# Patient Record
Sex: Male | Born: 1935 | Race: White | Hispanic: No | Smoking: Former smoker
Health system: Southern US, Community
[De-identification: ages and names within clinical notes are randomized; demographics above are authoritative.]

## PROBLEM LIST (undated history)

## (undated) DIAGNOSIS — I719 Aortic aneurysm of unspecified site, without rupture: Secondary | ICD-10-CM

## (undated) DIAGNOSIS — C61 Malignant neoplasm of prostate: Secondary | ICD-10-CM

## (undated) HISTORY — PX: ABDOMINAL SURGERY: SHX537

## (undated) HISTORY — PX: HERNIA REPAIR: SHX51

---

## 1998-12-11 ENCOUNTER — Ambulatory Visit (HOSPITAL_COMMUNITY): Admission: RE | Admit: 1998-12-11 | Discharge: 1998-12-11 | Payer: Self-pay | Admitting: General Surgery

## 1998-12-11 ENCOUNTER — Encounter (INDEPENDENT_AMBULATORY_CARE_PROVIDER_SITE_OTHER): Payer: Self-pay | Admitting: Specialist

## 2005-04-22 ENCOUNTER — Encounter: Admission: RE | Admit: 2005-04-22 | Discharge: 2005-04-22 | Payer: Self-pay | Admitting: Family Medicine

## 2008-01-29 ENCOUNTER — Ambulatory Visit (HOSPITAL_COMMUNITY): Admission: RE | Admit: 2008-01-29 | Discharge: 2008-01-29 | Payer: Self-pay | Admitting: Urology

## 2008-02-09 ENCOUNTER — Ambulatory Visit: Payer: Self-pay | Admitting: Vascular Surgery

## 2008-03-01 ENCOUNTER — Ambulatory Visit: Payer: Self-pay | Admitting: Vascular Surgery

## 2008-03-12 ENCOUNTER — Encounter: Payer: Self-pay | Admitting: Vascular Surgery

## 2008-03-12 ENCOUNTER — Ambulatory Visit: Payer: Self-pay | Admitting: Vascular Surgery

## 2008-03-12 ENCOUNTER — Inpatient Hospital Stay (HOSPITAL_COMMUNITY): Admission: RE | Admit: 2008-03-12 | Discharge: 2008-03-16 | Payer: Self-pay | Admitting: Vascular Surgery

## 2008-03-28 ENCOUNTER — Ambulatory Visit: Payer: Self-pay | Admitting: Vascular Surgery

## 2008-04-26 ENCOUNTER — Ambulatory Visit: Payer: Self-pay | Admitting: Vascular Surgery

## 2009-02-17 ENCOUNTER — Other Ambulatory Visit: Payer: Self-pay | Admitting: Urology

## 2009-02-18 ENCOUNTER — Other Ambulatory Visit: Payer: Self-pay | Admitting: Urology

## 2009-02-18 ENCOUNTER — Inpatient Hospital Stay (HOSPITAL_COMMUNITY): Admission: AD | Admit: 2009-02-18 | Discharge: 2009-02-19 | Payer: Self-pay | Admitting: Urology

## 2009-02-24 ENCOUNTER — Emergency Department (HOSPITAL_COMMUNITY): Admission: EM | Admit: 2009-02-24 | Discharge: 2009-02-24 | Payer: Self-pay | Admitting: Emergency Medicine

## 2010-07-01 LAB — URINALYSIS, ROUTINE W REFLEX MICROSCOPIC
Bilirubin Urine: NEGATIVE
Nitrite: NEGATIVE
Protein, ur: NEGATIVE mg/dL
Urobilinogen, UA: 0.2 mg/dL (ref 0.0–1.0)
pH: 6.5 (ref 5.0–8.0)

## 2010-07-01 LAB — BASIC METABOLIC PANEL
CO2: 27 mEq/L (ref 19–32)
Calcium: 8.5 mg/dL (ref 8.4–10.5)
Chloride: 102 mEq/L (ref 96–112)
GFR calc Af Amer: >60 mL/min (ref >60)
Sodium: 136 mEq/L (ref 135–145)

## 2010-07-01 LAB — CBC
HCT: 37.5 % — ABNORMAL LOW (ref 39.0–52.0)
MCHC: 33.2 g/dL (ref 30.0–36.0)
MCHC: 34.1 g/dL (ref 30.0–36.0)
MCHC: 34.4 g/dL (ref 30.0–36.0)
MCV: 92.2 fL (ref 78.0–100.0)
MCV: 92.2 fL (ref 78.0–100.0)
Platelets: 107 10*3/uL — ABNORMAL LOW (ref 150–400)
Platelets: 116 10*3/uL — ABNORMAL LOW (ref 150–400)
RBC: 4.62 MIL/uL (ref 4.22–5.81)
RDW: 13.6 % (ref 11.5–15.5)
RDW: 14 % (ref 11.5–15.5)
WBC: 6.3 10*3/uL (ref 4.0–10.5)
WBC: 9.9 10*3/uL (ref 4.0–10.5)

## 2010-07-01 LAB — DIFFERENTIAL
Basophils Absolute: 0 10*3/uL (ref 0.0–0.1)
Eosinophils Absolute: 0 10*3/uL (ref 0.0–0.7)
Eosinophils Relative: 0 % (ref 0–5)
Lymphocytes Relative: 13 % (ref 12–46)
Neutro Abs: 4.9 10*3/uL (ref 1.7–7.7)
Neutrophils Relative %: 78 % — ABNORMAL HIGH (ref 43–77)

## 2010-07-01 LAB — URINE MICROSCOPIC-ADD ON

## 2010-08-11 NOTE — Discharge Summary (Signed)
George Lara, George Lara           ACCOUNT NO.:  [id number]   MEDICAL RECORD NO.:  [id number]          PATIENT TYPE:  INP   LOCATION:  2041                         FACILITY:  [facility]   PHYSICIAN:  [name], M.D.    DATE OF BIRTH:  Oct 31, 1921   DATE OF ADMISSION:  03/12/2008  DATE OF DISCHARGE:  03/16/2008                               DISCHARGE SUMMARY   FINAL DISCHARGE DIAGNOSIS:  Infrarenal abdominal aortic aneurysm.   PROCEDURES PERFORMED:  Repair of infrarenal abdominal aortic aneurysm  utilizing 14-mm Hemashield graft by Dr. Arbie Cookey on March 12, 2008.  Complications none.   CONDITION AT DISCHARGE:  Stable, improving.   DISCHARGE MEDICATIONS:  1. Percocet 5/325 one p.o. q.4 h. p.r.n. pain, total number of 30 were      given.  2. Cardizem CD 180 mg p.o. daily.   DISPOSITION:  He is being discharged home in stable condition with his  wounds healing well.  He is given careful instructions regarding the  care of his wounds and activity level.  He is to see Dr. Anne Fu on  March 27, 2008, at the [facility] at 9:45 a.m. and will be  seeing Dr. Arbie Cookey in 2 weeks with ABIs and staple removal.  The office  will make the appointments for Dr. Arbie Cookey.   BRIEF IDENTIFYING STATEMENT:  For complete details, please refer the  typed history and physical.  Briefly, this is a very pleasant 77-year-  old gentleman with prostate cancer was found to have a 10-cm infrarenal  abdominal aortic aneurysm, which was asymptomatic.  Dr. Arbie Cookey evaluated  him and recommended repair.  He was informed of the risks and benefits  of the procedure and after careful consideration, he elected to proceed  with surgery.   HOSPITAL COURSE:  Preoperative workup was completed as an outpatient.  He was brought in through same-day surgery and underwent the  aforementioned repair of his abdominal aortic aneurysm.  For complete  details, please refer the typed operative report.  The procedure was  without  complications.  He was returned to the postanesthesia care unit,  extubated.  He was observed overnight and was able to be transferred to  a bed on a surgical convalescent floor on the first postoperative day.  His diet and activity level were advanced as tolerated.  On March 16, 2008, he was desirous of discharge.  He was found to be stable.  His  wounds were healing well.  He was discharged home in stable condition.   During his hospitalization, he developed atrial fib/flutter which  converted to sinus rhythm with the judicious use of Cardizem.  We asked  [facility] to evaluate this, he was evaluated by Dr. Anne Fu.  We  do appreciate his diligent assistance.  He did obtain a TSH level, which  was 6.681.  This should be followed up in Dr. Anne Fu' office on March 27, 2008.      [name], PA      [name], M.D.  Electronically Signed    KEL/MEDQ  D:  03/16/2008  T:  03/16/2008  Job:  [id number] 

## 2010-08-11 NOTE — Assessment & Plan Note (Signed)
OFFICE VISIT   George Lara, George Lara  DOB:  20-Feb-1922                                       04/26/2008  NWGNF#:[id number]   Patient presents today for followup of his resection and grafting of an  abdominal aortic aneurysm, which was 10 cm in size.  This was found  during workup of prostate cancer.  Surgery was on 12/15.  Despite his  age of 45, patient had no difficulty whatsoever with surgery.  He  returned immediately to his usual preoperative activities and stamina.   He is here today for followup.  He is doing quite well.  His abdominal  incision looks good with no evidence of hernia.  He has 2+ femoral and  posterior tibial pulses.  His ankle-arm index  today is normal at 1  bilaterally.   He is released to return to full activity after an additional 6 weeks.  He is asked for no heavy lifting or abdominal strain for 6 more weeks.  He had questions regarding continuation of Diltiazem and will discuss  this with Dr. Anne Fu.  He also will continue his followup with Dr.  Retta Diones regarding his prostate cancer.  He will see [place] again on an as-  needed basis.   [name], M.D.  Electronically Signed   TFE/MEDQ  D:  04/26/2008  T:  04/29/2008  Job:  [id number]   cc:   Bertram Millard. Retta Diones, M.D.  Elsworth Soho, M.D.  Jake Bathe, MD

## 2010-08-11 NOTE — Consult Note (Signed)
George Lara, George Lara           ACCOUNT NO.:  [id number]   MEDICAL RECORD NO.:  [id number]          PATIENT TYPE:  INP   LOCATION:  2041                         FACILITY:  [facility]   PHYSICIAN:  Jake Bathe, MD      DATE OF BIRTH:  1921/09/20   DATE OF CONSULTATION:  03/13/2008  DATE OF DISCHARGE:                                 CONSULTATION   REQUESTING PHYSICIAN:  [name], MD   REASON FOR REFERRAL:  Mr. Maya is being seen at the request of Dr.  Arbie Cookey for evaluation of paroxysmal atrial flutter/fibrillation.   HISTORY OF PRESENT ILLNESS:  This is an 77 year old male whom I saw  previously in consultation for preoperative evaluation to abdominal  aortic aneurysm repair with a history of prostate cancer and hernia  repair in the 1980s who underwent abdominal aortic aneurysm repair with  a 14-mm Hemashield graft on March 12, 2008, at 11:05 a.m., who  yesterday in the evening hours developed new-onset atrial flutter at a  rapid ventricular response, rate of 132 beats per minute.  Cardizem drip  was utilized for rate control, and at approximately 4:00 a.m. this  morning, he converted back into normal sinus rhythm.  He denies any  chest pain, shortness of breath, fevers, chills.  He does have abdominal  discomfort surrounding his surgical site.  He has had no prior history  of atrial fibrillation.  He describes no stroke-like symptoms or no  prior stroke-like symptoms.   Prior to his surgery, a nuclear stress test was performed, which showed  an ejection fraction of 70% with no evidence of ischemia, overall low  risk.  His ECG prior to surgery shows sinus rhythm, right bundle-branch  block with PACs, and possible left atrial enlargement.  This is no  change from the prior ECG done in the office during presurgical  evaluation.  Telemetry, however, yesterday evening demonstrates atrial  flutter/coarse atrial fibrillation with ventricular response  approximately 132 beats  per minute.   PAST MEDICAL HISTORY:  1. Prostate cancer.  2. History of abdominal aortic aneurysm 10 cm, repaired by Dr. Arbie Cookey      as described above.  3. Hernia repair x2 in 1980s.   ALLERGIES:  CODEINE.   MEDICATIONS:  Prior to preoperative evaluation, he was taking no  medicines.  Currently, he is on Cardizem drip 5 mg an hour, famotidine,  Lasix 20 mg IV once a day.   SOCIAL HISTORY:  He is widowed, currently lives alone.  No smoking, quit  in the 1980s, although he does chew tobacco.  No alcohol use.  No  recreational drug use.  He is a retired Building control surveyor.   FAMILY HISTORY:  He has 1 brother that died of a massive heart attack at  age 52.  No other early family history of coronary artery disease.   REVIEW OF SYSTEMS:  No bleeding episodes, no syncope, no stroke-like  symptoms, no shortness of breath, no active chest pain.  Unless stated  above, all other 12 review of systems is negative.   PHYSICAL EXAMINATION:  VITAL SIGNS:  Blood pressure  currently 107/69,  sating 93% on 2 L, T-max was 99.1, T-current 96.5, heart rate currently  in the 70s, respirations 20.  GENERAL:  Alert and oriented x3 in no acute distress.  CARDIOVASCULAR:  Regular rate and rhythm with occasional ectopy.  LUNGS:  Clear to auscultation bilaterally anteriorly.  ABDOMEN:  Surgical scar clean, dry, and intact; nontender; positive  bowel sounds.  EXTREMITIES:  Trace edema bilateral lower extremity.  SKIN:  Warm, dry, and intact.  No rashes.  NEUROLOGIC:  Nonfocal.  No tremors noted.  PSYCH:  Normal affect.   LABORATORY DATA:  White count 10.9, hemoglobin 14, hematocrit 41,  platelet count 80.  Sodium 136, potassium 3.8, creatinine 0.8, glucose  147.  INR was 1.2.  Chest x-ray personally viewed showed mild  cardiomegaly with improved aeration when compared to prior chest x-ray.  ECGs personally viewed showed sinus rhythm with PACs, right bundle-  branch block which was chronic on March 13, 2008.  Telemetry at 1843  on March 13, 2008, demonstrated atrial flutter with rate of 132.  At  4:00 a.m. on March 14, 2008, he converted to normal sinus rhythm. TSH  pending.   ASSESSMENT AND PLAN:  This is an 77 year old male with new-onset  paroxysmal atrial fibrillation/flutter postoperatively.  1. Atrial flutter - currently in sinus rhythm, reassuring on Cardizem.      He did have frequent ectopy.  PACs which likely initiated the      propagation of atrial flutter.  I agree with the Cardizem use, and      I also agree with transition to p.o. 60 mg q.8 h. in the morning      with transition up on discharge to Cardizem long acting or CD 180      mg once a day.  His blood pressure should be able to tolerate this.      It is slightly low this morning.  He does not need to take his      perioperative beta-blocker with the Cardizem.  Reassuring ejection      fraction on nuclear stress test recently.  We will assess structure      of heart in close outpatient followup.  Given his short duration of      atrial flutter/fibrillation, aspirin is reasonable at this time,      Dr. Arbie Cookey feels comfortable initiating antiplatelet therapy.  No      need for Coumadin at this point.  No stroke-like symptoms noted.  2. Abnormal EKG - right bundle-branch block - chronic.  Note on      telemetry, he did break his atrial flutter with a pause, which is      common.  Pulse was less than 2 seconds.   I have a followup appointment scheduled for him on Wednesday, March 27, 2008, at 9:45 a.m.      Jake Bathe, MD  Electronically Signed     MCS/MEDQ  D:  03/14/2008  T:  03/15/2008  Job:  [id number]   cc:   [name], M.D.  Bryan Lemma. Manus Gunning, M.D.

## 2010-08-11 NOTE — Op Note (Signed)
George Lara, George Lara           ACCOUNT NO.:  [id number]   MEDICAL RECORD NO.:  [id number]          PATIENT TYPE:  INP   LOCATION:  2309                         FACILITY:  [facility]   PHYSICIAN:  [name], M.D.    DATE OF BIRTH:  07/30/1921   DATE OF PROCEDURE:  DATE OF DISCHARGE:                               OPERATIVE REPORT   PREOPERATIVE DIAGNOSIS:  A 10-cm infrarenal abdominal aortic aneurysm.   POSTOPERATIVE DIAGNOSES:  A 10-cm infrarenal abdominal aortic aneurysm.   PROCEDURE:  Resection and grafting of abdominal aortic aneurysm with a  14-mm aortic tube graft.   SURGEON:  Dr. Gretta Began, MD.   ASSISTANT:  [name], [facility].   ANESTHESIA:  General endotracheal.   COMPLICATIONS:  None.   DISPOSITION:  To recovery room, extubated and in stable condition.   PROCEDURE IN DETAIL:  The patient was taken to the operating room,  placed in supine position.  The area of the abdomen and both groins were  prepped and draped in usual sterile fashion.  Incisions were made from  the level of xiphoid, below the umbilicus and carried down through the  midline fascia with electrocautery.  The peritoneum was entered, and the  midline fascia was opened in line with the skin incision.  The [employer]  retractor was used for exposure.  The transverse colon and the omentum  reflected superiorly and the small bowel was reflected to the right.  The duodenum was mobilized off the aortic neck.  The patient had a very  large aneurysm with an angulated aortic neck.  By traction on the  aneurysm, the aorta was encircled below the level of the renal arteries  and was adequate for proximal anastomosis.  Next, the dissection was  extended down on to the aortic bifurcation and the common iliac arteries  were encircled with blue vessel loops bilaterally.  The patient was  given 25 g of mannitol and 8000 units of intravenous heparin.  After  adequate circulation time, the aorta was closed with a  Harken clamp  below the level of the renal arteries and the common iliac arteries were  occluded with Henley clamps bilaterally.  The aneurysm was opened with  electrocautery through the midline longitudinally.  A large amount of  mural thrombus was removed.  The lumbar back bridges were controlled  with 2-0 silk figure-of-eight sutures.  The aorta was transected below  the level of the proximal clamp.  The bifurcation was visualized and the  aneurysm stopped at the level of the bifurcation.  The aorta was  transected at the level of the aortic bifurcation and was adequate for  distal anastomosis.  A 14 mm Dacron graft, Hemashield graft was brought  onto the field, and using a felt strip for reinforcement was sewn end-to-  end to the proximal aorta with a running 3-0 Prolene suture.  The  anastomosis was tested and found to be adequate.  Next, the graft was  cut to appropriate length and was sewn end-to-end to the distal aorta at  the level of the aortic bifurcation.  Again, a felt strip was  used for  reinforcement.  Prior to completion of the anastomosis, the usual  flushing maneuvers were undertaken with flushing of both iliac and the  aortic graft.  Anastomosis was completed and flow was restored first to  the left then the right leg.  Good femoral pulses were noted.  The  patient was given 50 mg of protamine to reverse the heparin.  The  aneurysm with the sac was closed over the graft with a running 2-0  Vicryl suture.  Next, the retroperitoneum was closed with a running 2-0  Vicryl suture.  Finally, the thrombi was run to its entirety and was  placed back in the pelvis.  The transverse colon and omentum were placed  over this.  The midline fascia was closed with a #1 PDS suture beginning  proximally and distally and tying in the middle.  The skin was closed  with skin staples.  Sterile dressing was applied.  The patient had  palpable dorsalis pedis pulses and was extubated in the  operating room  and transferred to the recovery room in stable condition.      [name], M.D.  Electronically Signed     TFE/MEDQ  D:  03/12/2008  T:  03/13/2008  Job:  [id number]   cc:   L. Lupe Carney, M.D.  Jake Bathe, MD

## 2010-08-11 NOTE — Consult Note (Signed)
NEW PATIENT CONSULTATION   George Lara, George Lara  DOB:  May 11, 1921                                       02/09/2008  EAVWU#:[id number]   The patient presents today for discussion regarding recent finding of a  large 10 cm infrarenal abdominal aortic aneurysm.  The patient is a very  pleasant 77 year old gentleman with a history of prostate cancer.  He  was being evaluated by Dr. Marcine Lara with a CT scan on February 07, 2008.  This showed an incidental finding of a 9.7 cm abdominal  aortic aneurysm.  I am seeing him for further discussion.  He had no  known history of this and does not have a family history of aneurysmal  disease.  He is extremely active despite his age of 26.  He specifically  denies any cardiac disease.  He does have a history of inguinal hernia  repair.   CURRENT MEDICATIONS:  None.   FAMILY HISTORY:  Maternal history of myocardial infarction.  Family  history of lung cancer.   SOCIAL HISTORY:  He is widowed.  He is retired.  Quit smoking 20 years  ago, does chew tobacco, does not have alcohol consumption.   PHYSICAL EXAM:  He is a well-developed, well-nourished white male,  appearing stated age 13.  His blood pressure 130/89, pulse 83,  respirations 18.  His carotid arteries have bruits bilaterally.  Heart:  Regular rate and rhythm.  Chest:  clear.  He does have easily palpable,  nontender abdominal aortic aneurysm.  He has 2+ femoral and 2+ dorsalis  pedis pulses.  He does have prominent popliteal pulses bilaterally.  He  has his disc from urology center with him.  Unfortunately, this will not  load in our computer and we discussed this with the urology center and  they will send [place] a new disc next week, it is Friday.  I discussed  options of repair with the patient and his daughter.  I explained that  despite his age of 32 with a 10 cm aneurysm, this certainly is a most  life-threatening issue.  I did explain open repair and  stent graft  repair depending on his CT findings.  My office will check with Dr.  Clovis Lara to determine his preference for cardiologist, but would  recommend a preoperative cardiac evaluation.  I explained to the patient  and his family the need for this, due to his unknown cardiac status, to  risk stratify him for aortic surgery.  We will discuss this further with  him following cardiac evaluation and plan for elective repair, either  open or stent graft, assuming his cardiac evaluation is negative.   George Lara, M.D.  Electronically Signed   TFE/MEDQ  D:  02/09/2008  T:  02/12/2008  Job:  [id number]   cc:   George Lara, M.D.  George Lara, M.D.

## 2010-08-11 NOTE — H&P (Signed)
HISTORY AND PHYSICAL EXAMINATION   March 01, 2008   Re:  George Lara, George Lara                 DOB:  [birth year]   ADMISSION DIAGNOSIS:  Large infrarenal abdominal aortic aneurysm.   HISTORY OF PRESENT ILLNESS:  The patient is a very pleasant 77 year old  white male with prostate cancer.  He was undergoing CT scan for further  evaluation of this by Dr. Marcine Matar and was found to have a 10  cm infrarenal abdominal aortic aneurysm.  He had no symptoms referable  to this.  I have seen him for initial evaluation on 11/13 and have  recommended cardiology evaluation to rule out any hiding other cardiac  difficulty.  Despite his age of 43, he is extremely active with no major  medical difficulties.  He has no prior cardiac disease and has no  physical limitations.  He has undergone a recent cardiac evaluation to  include a stress perfusion scan that shows a normal ejection fraction at  70%.  Good exercise capacity and no evidence of reversible ischemia.   FAMILY HISTORY:  Negative for premature atherosclerotic disease.  Does  have a history of lung cancer in his family.   SOCIAL HISTORY:  He is married and is retired.  He quit smoking 20 years  ago.  Does chew tobacco and does not have alcohol consumption.   PHYSICAL EXAM:  Well-developed, well-nourished thin white male appearing  his stated age of 21.  Blood pressure is 144/74, pulse 80, respirations  16.  His carotid arteries are without bruits bilaterally.  He has 2+  radial, 2+ femoral, 2+ posterior tibial pulses.  Abdominal exam reveals  a large, nontender abdominal aortic aneurysm.   I reviewed his CAT scan with the patient and his son and daughter  present.  I explained that he is not a candidate for endovascular repair  due to an extremely angulated neck of his infrarenal aorta.  I explained  that his only option would be open surgical repair.  I did explain the  potential risk of the procedure to  include renal dysfunction, GI  dysfunction, pulmonary and cardiac dysfunction.  I explained the  magnitude of this procedure, including expected 1 to 2 day intensive  care stay and a 5 to 7 day hospitalization, and return to usual  activities and stamina at approximately 2 months postoperatively.  He  understands and wishes to proceed with surgery, and we have scheduled  this for admission day surgery on 12/15.   [name], M.D.  Electronically Signed   TFE/MEDQ  D:  03/01/2008  T:  03/04/2008  Job:  [id number]   cc:   Bertram Millard. Retta Diones, M.D.  Elsworth Soho, M.D.  Jake Bathe, MD

## 2011-01-01 LAB — BASIC METABOLIC PANEL
BUN: 10 mg/dL (ref 6–23)
BUN: 9 mg/dL (ref 6–23)
CO2: 23 mEq/L (ref 19–32)
CO2: 24 mEq/L (ref 19–32)
CO2: 24 mEq/L (ref 19–32)
Calcium: 8.3 mg/dL — ABNORMAL LOW (ref 8.4–10.5)
Calcium: 8.7 mg/dL (ref 8.4–10.5)
Creatinine, Ser: 0.8 mg/dL (ref 0.4–1.5)
GFR calc Af Amer: >60 mL/min (ref >60)
GFR calc Af Amer: >60 mL/min (ref >60)
GFR calc non Af Amer: >60 mL/min (ref >60)
Glucose, Bld: 144 mg/dL — ABNORMAL HIGH (ref 70–99)
Glucose, Bld: 96 mg/dL (ref 70–99)
Potassium: 4.4 mEq/L (ref 3.5–5.1)
Sodium: 136 mEq/L (ref 135–145)
Sodium: 137 mEq/L (ref 135–145)

## 2011-01-01 LAB — POCT I-STAT 7, (LYTES, BLD GAS, ICA,H+H)
Acid-base deficit: 3 mmol/L — ABNORMAL HIGH (ref 0.0–2.0)
Bicarbonate: 23.3 mEq/L (ref 20.0–24.0)
Calcium, Ion: 1.09 mmol/L — ABNORMAL LOW (ref 1.12–1.32)
O2 Saturation: 100 %
Potassium: 4.4 mEq/L (ref 3.5–5.1)
TCO2: 25 mmol/L (ref 0–100)
pCO2 arterial: 42.6 mmHg (ref 35.0–45.0)
pH, Arterial: 7.334 — ABNORMAL LOW (ref 7.350–7.450)

## 2011-01-01 LAB — CBC
HCT: 41.2 % (ref 39.0–52.0)
HCT: 43 % (ref 39.0–52.0)
HCT: 47.5 % (ref 39.0–52.0)
Hemoglobin: 13.8 g/dL (ref 13.0–17.0)
Hemoglobin: 14.4 g/dL (ref 13.0–17.0)
MCHC: 33.4 g/dL (ref 30.0–36.0)
MCHC: 34 g/dL (ref 30.0–36.0)
Platelets: 77 10*3/uL — ABNORMAL LOW (ref 150–400)
Platelets: 80 10*3/uL — ABNORMAL LOW (ref 150–400)
Platelets: 87 10*3/uL — ABNORMAL LOW (ref 150–400)
RBC: 4.32 MIL/uL (ref 4.22–5.81)
RBC: 4.55 MIL/uL (ref 4.22–5.81)
RBC: 4.69 MIL/uL (ref 4.22–5.81)
RBC: 5.19 MIL/uL (ref 4.22–5.81)
RDW: 14.5 % (ref 11.5–15.5)
RDW: 14.6 % (ref 11.5–15.5)
WBC: 10.7 10*3/uL — ABNORMAL HIGH (ref 4.0–10.5)
WBC: 6.3 10*3/uL (ref 4.0–10.5)
WBC: 8.3 10*3/uL (ref 4.0–10.5)
WBC: 9.5 10*3/uL (ref 4.0–10.5)

## 2011-01-01 LAB — BLOOD GAS, ARTERIAL
Acid-Base Excess: 1.2 mmol/L (ref 0.0–2.0)
Acid-base deficit: 2.2 mmol/L — ABNORMAL HIGH (ref 0.0–2.0)
Bicarbonate: 24.8 mEq/L — ABNORMAL HIGH (ref 20.0–24.0)
FIO2: 0.21 %
Patient temperature: 98.6
TCO2: 23.8 mmol/L (ref 0–100)
TCO2: 25.9 mmol/L (ref 0–100)
pH, Arterial: 7.451 — ABNORMAL HIGH (ref 7.350–7.450)
pO2, Arterial: 81.7 mmHg (ref 80.0–100.0)

## 2011-01-01 LAB — TYPE AND SCREEN
ABO/RH(D): O POS
Antibody Screen: NEGATIVE

## 2011-01-01 LAB — TSH: TSH: 6.681 u[IU]/mL — ABNORMAL HIGH (ref 0.350–4.500)

## 2011-01-01 LAB — POCT I-STAT 3, ART BLOOD GAS (G3+)
Bicarbonate: 22 mEq/L (ref 20.0–24.0)
TCO2: 23 mmol/L (ref 0–100)
pCO2 arterial: 34.6 mmHg — ABNORMAL LOW (ref 35.0–45.0)
pH, Arterial: 7.415 (ref 7.350–7.450)

## 2011-01-01 LAB — URINALYSIS, ROUTINE W REFLEX MICROSCOPIC
Bilirubin Urine: NEGATIVE
Glucose, UA: NEGATIVE mg/dL
Hgb urine dipstick: NEGATIVE
Nitrite: NEGATIVE
Specific Gravity, Urine: 1.014 (ref 1.005–1.030)
pH: 7 (ref 5.0–8.0)

## 2011-01-01 LAB — COMPREHENSIVE METABOLIC PANEL
ALT: 20 U/L (ref 0–53)
Albumin: 2.8 g/dL — ABNORMAL LOW (ref 3.5–5.2)
Alkaline Phosphatase: 74 U/L (ref 39–117)
BUN: 9 mg/dL (ref 6–23)
Chloride: 102 mEq/L (ref 96–112)
Potassium: 3.8 mEq/L (ref 3.5–5.1)
Total Bilirubin: 1.3 mg/dL — ABNORMAL HIGH (ref 0.3–1.2)

## 2011-01-01 LAB — URINE MICROSCOPIC-ADD ON

## 2011-01-01 LAB — AMYLASE: Amylase: 39 U/L (ref 27–131)

## 2011-01-01 LAB — ABO/RH: ABO/RH(D): O POS

## 2012-02-01 ENCOUNTER — Other Ambulatory Visit: Payer: Self-pay | Admitting: Urology

## 2012-02-01 DIAGNOSIS — C61 Malignant neoplasm of prostate: Secondary | ICD-10-CM

## 2012-02-23 ENCOUNTER — Ambulatory Visit (HOSPITAL_COMMUNITY): Payer: Self-pay

## 2012-02-23 ENCOUNTER — Encounter (HOSPITAL_COMMUNITY): Payer: Self-pay

## 2013-07-12 ENCOUNTER — Encounter (HOSPITAL_COMMUNITY): Payer: Self-pay | Admitting: Emergency Medicine

## 2013-07-12 ENCOUNTER — Emergency Department (HOSPITAL_COMMUNITY)
Admission: EM | Admit: 2013-07-12 | Discharge: 2013-07-12 | Disposition: A | Discharge status: Home / Self Care | Payer: Medicare Other | Attending: Emergency Medicine | Admitting: Emergency Medicine

## 2013-07-12 DIAGNOSIS — N390 Urinary tract infection, site not specified: Secondary | ICD-10-CM | POA: Insufficient documentation

## 2013-07-12 DIAGNOSIS — Z8679 Personal history of other diseases of the circulatory system: Secondary | ICD-10-CM | POA: Insufficient documentation

## 2013-07-12 DIAGNOSIS — Z87891 Personal history of nicotine dependence: Secondary | ICD-10-CM | POA: Insufficient documentation

## 2013-07-12 DIAGNOSIS — R319 Hematuria, unspecified: Secondary | ICD-10-CM

## 2013-07-12 DIAGNOSIS — Z8546 Personal history of malignant neoplasm of prostate: Secondary | ICD-10-CM | POA: Insufficient documentation

## 2013-07-12 DIAGNOSIS — N139 Obstructive and reflux uropathy, unspecified: Secondary | ICD-10-CM

## 2013-07-12 HISTORY — DX: Malignant neoplasm of prostate: C61

## 2013-07-12 HISTORY — DX: Aortic aneurysm of unspecified site, without rupture: I71.9

## 2013-07-12 LAB — COMPREHENSIVE METABOLIC PANEL
ALBUMIN: 3.6 g/dL (ref 3.5–5.2)
ALK PHOS: 101 U/L (ref 39–117)
ALT: 15 U/L (ref 0–53)
AST: 16 U/L (ref 0–37)
BILIRUBIN TOTAL: 0.6 mg/dL (ref 0.3–1.2)
BUN: 33 mg/dL — ABNORMAL HIGH (ref 6–23)
CALCIUM: 9.3 mg/dL (ref 8.4–10.5)
CO2: 23 meq/L (ref 19–32)
CREATININE: 0.82 mg/dL (ref 0.50–1.35)
Chloride: 104 mEq/L (ref 96–112)
GFR calc non Af Amer: 76 mL/min — ABNORMAL LOW (ref >90)
GFR, EST AFRICAN AMERICAN: 88 mL/min — AB (ref >90)
Glucose, Bld: 187 mg/dL — ABNORMAL HIGH (ref 70–99)
Potassium: 3.9 mEq/L (ref 3.7–5.3)
Sodium: 142 mEq/L (ref 137–147)
Total Protein: 6.7 g/dL (ref 6.0–8.3)

## 2013-07-12 LAB — CBC
HCT: 42.4 % (ref 39.0–52.0)
Hemoglobin: 14.5 g/dL (ref 13.0–17.0)
MCH: 30.9 pg (ref 26.0–34.0)
MCHC: 34.2 g/dL (ref 30.0–36.0)
MCV: 90.2 fL (ref 78.0–100.0)
PLATELETS: 141 10*3/uL — AB (ref 150–400)
RBC: 4.7 MIL/uL (ref 4.22–5.81)
RDW: 14.5 % (ref 11.5–15.5)
WBC: 6.8 10*3/uL (ref 4.0–10.5)

## 2013-07-12 LAB — URINALYSIS, ROUTINE W REFLEX MICROSCOPIC
BILIRUBIN URINE: NEGATIVE
GLUCOSE, UA: NEGATIVE mg/dL
KETONES UR: NEGATIVE mg/dL
Nitrite: POSITIVE — AB
PROTEIN: NEGATIVE mg/dL
Specific Gravity, Urine: 1.022 (ref 1.005–1.030)
Urobilinogen, UA: 0.2 mg/dL (ref 0.0–1.0)
pH: 5.5 (ref 5.0–8.0)

## 2013-07-12 LAB — URINE MICROSCOPIC-ADD ON

## 2013-07-12 MED ORDER — CIPROFLOXACIN HCL 500 MG PO TABS: 500.0000 mg | ORAL_TABLET | Freq: Two times a day (BID) | ORAL | Status: DC

## 2013-07-12 MED ORDER — CIPROFLOXACIN HCL 500 MG PO TABS
500.0000 mg | ORAL_TABLET | Freq: Once | ORAL | Status: AC
  Administered 2013-07-12: 500 mg via ORAL
  Filled 2013-07-12: qty 1

## 2013-07-12 NOTE — Discharge Instructions (Signed)
Urinary Tract Infection A urinary tract infection (UTI) can occur any place along the urinary tract. The tract includes the kidneys, ureters, bladder, and urethra. A type of germ called bacteria often causes a UTI. UTIs are often helped with antibiotic medicine.  HOME CARE   If given, take antibiotics as told by your doctor. Finish them even if you start to feel better.  Drink enough fluids to keep your pee (urine) clear or pale yellow.  Avoid tea, drinks with caffeine, and bubbly (carbonated) drinks.  Pee often. Avoid holding your pee in for a long time.  Pee before and after having sex (intercourse).  Wipe from front to back after you poop (bowel movement) if you are a woman. Use each tissue only once. GET HELP RIGHT AWAY IF:   You have back pain.  You have lower belly (abdominal) pain.  You have chills.  You feel sick to your stomach (nauseous).  You throw up (vomit).  Your burning or discomfort with peeing does not go away.  You have a fever.  Your symptoms are not better in 3 days. MAKE SURE YOU:   Understand these instructions.  Will watch your condition.  Will get help right away if you are not doing well or get worse. Document Released: 09/01/2007 Document Revised: 12/08/2011 Document Reviewed: 10/14/2011 [facility] Patient Information 2014 [place], [place]. Foley Catheter Care, Adult A Foley catheter is a soft, flexible tube that is placed into the bladder to drain urine. A Foley catheter may be inserted if:  You leak urine or are not able to control when you urinate (urinary incontinence).  You are not able to urinate when you need to (urinary retention).  You had prostate surgery or surgery on the genitals.  You have certain medical conditions, such as multiple sclerosis, dementia, or a spinal cord injury. If you are going home with a Foley catheter in place, follow the instructions below. TAKING CARE OF THE CATHETER 1. Wash your hands with soap and  water. 2. Using mild soap and warm water on a clean washcloth:  Clean the area on your body closest to the catheter insertion site using a circular motion, moving away from the catheter. Never wipe toward the catheter because this could sweep bacteria up into the urethra and cause infection.  Remove all traces of soap. Pat the area dry with a clean towel. For males, reposition the foreskin. 3. Attach the catheter to your leg so there is no tension on the catheter. Use adhesive tape or a leg strap. If you are using adhesive tape, remove any sticky residue left behind by the previous tape you used. 4. Keep the drainage bag below the level of the bladder, but keep it off the floor. 5. Check throughout the day to be sure the catheter is working and urine is draining freely. Make sure the tubing does not become kinked. 6. Do not pull on the catheter or try to remove it. Pulling could damage internal tissues. TAKING CARE OF THE DRAINAGE BAGS You will be given two drainage bags to take home. One is a large overnight drainage bag, and the other is a smaller leg bag that fits underneath clothing. You may wear the overnight bag at any time, but you should never wear the smaller leg bag at night. Follow the instructions below for how to empty, change, and clean your drainage bags. Emptying the Drainage Bag You must empty your drainage bag when it is    full or at least  2 3 times a day. 1. Wash your hands with soap and water. 2. Keep the drainage bag below your hips, below the level of your bladder. This stops urine from going back into the tubing and into your bladder. 3. Hold the dirty bag over the toilet or a clean container. 4. Open the pour spout at the bottom of the bag and empty the urine into the toilet or container. Do not let the pour spout touch the toilet, container, or any other surface. Doing so can place bacteria on the bag, which can cause an infection. 5. Clean the pour spout with a gauze pad  or cotton ball that has rubbing alcohol on it. 6. Close the pour spout. 7. Attach the bag to your leg with adhesive tape or a leg strap. 8. Wash your hands well. Changing the Drainage Bag Change your drainage bag once a month or sooner if it starts to smell bad or look dirty. Below are steps to follow when changing the drainage bag. 1. Wash your hands with soap and water. 2. Pinch off the rubber catheter so that urine does not spill out. 3. Disconnect the catheter tube from the drainage tube at the connection valve. Do not let the tubes touch any surface. 4. Clean the end of the catheter tube with an alcohol wipe. Use a different alcohol wipe to clean the end of the drainage tube. 5. Connect the catheter tube to the drainage tube of the clean drainage bag. 6. Attach the new bag to the leg with adhesive tape or a leg strap. Avoid attaching the new bag too tightly. 7. Wash your hands well. Cleaning the Drainage Bag 1. Wash your hands with soap and water. 2. Wash the bag in warm, soapy water. 3. Rinse the bag thoroughly with warm water. 4. Fill the bag with a solution of white vinegar and water (1 cup vinegar to 1 qt warm water [.2 L vinegar to 1 L warm water]). Close the bag and soak it for 30 minutes in the solution. 5. Rinse the bag with warm water. 6. Hang the bag to dry with the pour spout open and hanging downward. 7. Store the clean bag (once it is dry) in a clean plastic bag. 8. Wash your hands well. PREVENTING INFECTION  Wash your hands before and after handling your catheter.  Take showers daily and wash the area where the catheter enters your body. Do not take baths. Replace wet leg straps with dry ones, if this applies.  Do not use powders, sprays, or lotions on the genital area. Only use creams, lotions, or ointments as directed by your caregiver.  For females, wipe from front to back after each bowel movement.  Drink enough fluids to keep your urine clear or pale yellow  unless you have a fluid restriction.  Do not let the drainage bag or tubing touch or lie on the floor.  Wear cotton underwear to absorb moisture and to keep your skin drier. SEEK MEDICAL CARE IF:   Your urine is cloudy or smells unusually bad.  Your catheter becomes clogged.  You are not draining urine into the bag or your bladder feels full.  Your catheter starts to leak. SEEK IMMEDIATE MEDICAL CARE IF:   You have pain, swelling, redness, or pus where the catheter enters the body.  You have pain in the abdomen, legs, lower back, or bladder.  You have a fever.  You see blood fill the catheter, or your urine is pink  or red.  You have nausea, vomiting, or chills.  Your catheter gets pulled out. MAKE SURE YOU:   Understand these instructions.  Will watch your condition.  Will get help right away if you are not doing well or get worse. Document Released: 03/15/2005 Document Revised: 07/10/2012 Document Reviewed: 03/06/2012 [facility] Patient Information 2014 [place].

## 2013-07-12 NOTE — ED Notes (Addendum)
Family reports pt has a hx of prostate cancer and pt had an episode of bleeding during urination. Pt did a self cath today to help him urinate which is something he never does. Pt reports bleeding with clots. Pt does states that he feels like he has to go to the bathroom but he can't go. Pt alert and ambulatory to triage. Family in room.

## 2013-07-12 NOTE — ED Provider Notes (Signed)
CSN: [id number]     Arrival date & time 07/12/13  2002 History   First MD Initiated Contact with Patient 07/12/13 2150     Chief Complaint  Patient presents with  . Hematuria      HPI Family reports pt has a hx of prostate cancer and pt had an episode of bleeding during urination. Pt did a self cath today to help him urinate which is something he never does. Pt reports bleeding with clots. Pt does states that he feels like he has to go to the bathroom but he can't go. Pt alert and ambulatory to triage.  Past Medical History  Diagnosis Date  . Prostate cancer   . Aortic aneurysm    History reviewed. No pertinent past surgical history. History reviewed. No pertinent family history. History  Substance Use Topics  . Smoking status: Former Research scientist (life sciences)  . Smokeless tobacco: Current User    Types: Chew  . Alcohol Use: No    Review of Systems  All other systems reviewed and are negative  Allergies  Codeine and Tamsulosin  Home Medications   Prior to Admission medications   Medication Sig Start Date End Date Taking? Authorizing Provider  ciprofloxacin (CIPRO) 500 MG tablet Take 1 tablet (500 mg total) by mouth every 12 (twelve) hours. 07/12/13   Dot Lanes, MD   BP 107/71  Pulse 79  Temp(Src) 98.2 F (36.8 C) (Oral)  Resp 18  SpO2 99% Physical Exam  Nursing note and vitals reviewed. Constitutional: He is oriented to person, place, and time. He appears well-developed and well-nourished. No distress.  HENT:  Head: Normocephalic and atraumatic.  Eyes: Pupils are equal, round, and reactive to light.  Neck: Normal range of motion.  Cardiovascular: Normal rate and intact distal pulses.   Pulmonary/Chest: No respiratory distress.  Abdominal: Soft. Normal appearance. He exhibits no distension. There is tenderness in the suprapubic area.  Musculoskeletal: Normal range of motion.  Neurological: He is alert and oriented to person, place, and time. No cranial nerve deficit.  Skin:  Skin is warm and dry. No rash noted.  Psychiatric: He has a normal mood and affect. His behavior is normal.    ED Course  Procedures (including critical care time)  Urinary catheter placed Labs Review Labs Reviewed  CBC - Abnormal; Notable for the following:    Platelets 141 (*)    All other components within normal limits  COMPREHENSIVE METABOLIC PANEL - Abnormal; Notable for the following:    Glucose, Bld 187 (*)    BUN 33 (*)    GFR calc non Af Amer 76 (*)    GFR calc Af Amer 88 (*)    All other components within normal limits  URINALYSIS, ROUTINE W REFLEX MICROSCOPIC - Abnormal; Notable for the following:    Color, Urine AMBER (*)    APPearance CLOUDY (*)    Hgb urine dipstick LARGE (*)    Nitrite POSITIVE (*)    Leukocytes, UA LARGE (*)    All other components within normal limits  URINE MICROSCOPIC-ADD ON - Abnormal; Notable for the following:    Bacteria, UA MANY (*)    All other components within normal limits  URINE CULTURE   Medications  ciprofloxacin (CIPRO) tablet 500 mg (500 mg Oral Given 07/12/13 2248)         MDM   Final diagnoses:  Hematuria  Urinary obstruction  UTI (lower urinary tract infection)        Dot Lanes, MD  07/19/13 0724 

## 2013-07-15 LAB — URINE CULTURE: Special Requests: NORMAL

## 2013-07-16 NOTE — ED Notes (Signed)
Treated per protocol MD °

## 2013-07-18 ENCOUNTER — Telehealth (HOSPITAL_BASED_OUTPATIENT_CLINIC_OR_DEPARTMENT_OTHER): Payer: Self-pay | Admitting: Emergency Medicine

## 2013-07-18 NOTE — Telephone Encounter (Signed)
Post ED Visit - Positive Culture Follow-up  Culture report reviewed by antimicrobial stewardship pharmacist: []  George Lara, Pharm.D., BCPS [x]  George Lara, Pharm.D., BCPS []  George Lara, Pharm.D., BCPS []  George Lara, Pharm.D., BCPS, AAHIVP []  George Lara, Pharm.D., BCPS, AAHIVP []  George Lara, Pharm.D.  Positive urine culture Treated with Cipro, organism sensitive to the same and no further patient follow-up is required at this time.  George Lara 07/18/2013, 12:12 PM

## 2013-09-01 ENCOUNTER — Encounter (HOSPITAL_COMMUNITY): Payer: Self-pay | Admitting: Emergency Medicine

## 2013-09-01 ENCOUNTER — Emergency Department (HOSPITAL_COMMUNITY)
Admission: EM | Admit: 2013-09-01 | Discharge: 2013-09-01 | Disposition: A | Discharge status: Home / Self Care | Payer: Medicare Other | Attending: Emergency Medicine | Admitting: Emergency Medicine

## 2013-09-01 DIAGNOSIS — Y846 Urinary catheterization as the cause of abnormal reaction of the patient, or of later complication, without mention of misadventure at the time of the procedure: Secondary | ICD-10-CM | POA: Insufficient documentation

## 2013-09-01 DIAGNOSIS — R339 Retention of urine, unspecified: Secondary | ICD-10-CM | POA: Insufficient documentation

## 2013-09-01 DIAGNOSIS — Z7982 Long term (current) use of aspirin: Secondary | ICD-10-CM | POA: Insufficient documentation

## 2013-09-01 DIAGNOSIS — Z8679 Personal history of other diseases of the circulatory system: Secondary | ICD-10-CM | POA: Insufficient documentation

## 2013-09-01 DIAGNOSIS — T83091A Other mechanical complication of indwelling urethral catheter, initial encounter: Secondary | ICD-10-CM | POA: Insufficient documentation

## 2013-09-01 DIAGNOSIS — T83011A Breakdown (mechanical) of indwelling urethral catheter, initial encounter: Secondary | ICD-10-CM

## 2013-09-01 DIAGNOSIS — Z87891 Personal history of nicotine dependence: Secondary | ICD-10-CM | POA: Insufficient documentation

## 2013-09-01 DIAGNOSIS — Z8546 Personal history of malignant neoplasm of prostate: Secondary | ICD-10-CM | POA: Insufficient documentation

## 2013-09-01 DIAGNOSIS — Z792 Long term (current) use of antibiotics: Secondary | ICD-10-CM | POA: Insufficient documentation

## 2013-09-01 NOTE — ED Provider Notes (Signed)
CSN: [id number]     Arrival date & time 09/01/13  1908 History   First MD Initiated Contact with Patient 09/01/13 1919     Chief Complaint  Patient presents with  . Urinary Retention     (Consider location/radiation/quality/duration/timing/severity/associated sxs/prior Treatment) HPI 80 year old male with chronic indwelling Foley catheter requests catheter replacement, he is scheduled to have his Foley catheter replaced in a few days but his Foley catheter stopped working today, he has the sensation to urinate but his Foley catheter stopped draining, he has minimal suprapubic abdominal pain with the urge to urinate, he has no dysuria no fever no vomiting no severe abdominal pain no back pain no other concerns. Past Medical History  Diagnosis Date  . Prostate cancer   . Aortic aneurysm    Past Surgical History  Procedure Laterality Date  . Abdominal surgery     No family history on file. History  Substance Use Topics  . Smoking status: Former Research scientist (life sciences)  . Smokeless tobacco: Current User    Types: Chew  . Alcohol Use: No    Review of Systems 10 Systems reviewed and are negative for acute change except as noted in the HPI.   Allergies  Codeine and Tamsulosin  Home Medications   Prior to Admission medications   Medication Sig Start Date End Date Taking? Authorizing Provider  aspirin 81 MG chewable tablet Chew 81 mg by mouth daily.   Yes Historical Provider, MD  cephALEXin (KEFLEX) 500 MG capsule Take 1 capsule (500 mg total) by mouth 4 (four) times daily. 09/02/13   Leota Jacobsen, MD  phenazopyridine (PYRIDIUM) 200 MG tablet Take 1 tablet (200 mg total) by mouth 3 (three) times daily. 09/02/13   Leota Jacobsen, MD   BP 132/74  Pulse 62  Temp(Src) 97.8 F (36.6 C) (Oral)  Resp 18  SpO2 95% Physical Exam  Nursing note and vitals reviewed. Constitutional:  Awake, alert, nontoxic appearance.  HENT:  Head: Atraumatic.  Eyes: Right eye exhibits no discharge. Left eye  exhibits no discharge.  Neck: Neck supple.  Cardiovascular: Normal rate and regular rhythm.   No murmur heard. Pulmonary/Chest: Effort normal and breath sounds normal. No respiratory distress. He has no wheezes. He has no rales. He exhibits no tenderness.  Abdominal: Soft. Bowel sounds are normal. He exhibits no distension and no mass. There is no tenderness. There is no rebound and no guarding.  POCUS bladder reveals Foley catheter tip within bladder with bladder volume grossly enlarged consistent with urinary retention  Musculoskeletal: He exhibits no tenderness.  Baseline ROM, no obvious new focal weakness.  Neurological: He is alert.  Mental status and motor strength appears baseline for patient and situation.  Skin: No rash noted.  Psychiatric: He has a normal mood and affect.    ED Course  Procedures (including critical care time) Patient / Family / Caregiver informed of clinical course, understand medical decision-making process, and agree with plan. Labs Review Labs Reviewed - No data to display  Imaging Review No results found.   EKG Interpretation None      MDM   Final diagnoses:  Urinary retention  Malfunction of Foley catheter    I doubt any other EMC precluding discharge at this time including, but not necessarily limited to the following:SBI.    Babette Relic, MD 09/03/13 [phone]

## 2013-09-01 NOTE — Discharge Instructions (Signed)
Return sooner to the emergency department if you develop fever, vomiting, abdominal pain, back pain, inability to urinate, new bloody urine, or other concerns.

## 2013-09-01 NOTE — ED Notes (Signed)
Pt presents with c/o urinary retention. Pt has a catheter in place and noticed today that he has not been able to urinate, unsure of how many hours it has been. Family believes that this catheter has not been changed in approx 6 weeks. Pt says he is feeling some pain in his penis.

## 2013-09-01 NOTE — ED Notes (Signed)
Initial Contact - pt resting on stretcher with family at bedside, pt reports no urine output from foley today.  C/o pressure in lower abd. Pt is A+OX4.  Denies other complaints.  Abd distended, s/nt.  Changed to hospital gown.  Dr. Stevie Kern at bedside.  NAD.

## 2013-09-01 NOTE — ED Notes (Signed)
BSD bag changed to leg bag.  Pt aware of care as has chronic foley.  Verbalized understanding and denies questions.

## 2013-09-01 NOTE — ED Notes (Signed)
Foley inserted per order without issue, with immediate urine output. Pt tolerated procedure well.

## 2013-09-02 ENCOUNTER — Emergency Department (HOSPITAL_COMMUNITY)
Admission: EM | Admit: 2013-09-02 | Discharge: 2013-09-03 | Disposition: A | Discharge status: Home / Self Care | Payer: Medicare Other | Attending: Emergency Medicine | Admitting: Emergency Medicine

## 2013-09-02 ENCOUNTER — Encounter (HOSPITAL_COMMUNITY): Payer: Self-pay | Admitting: Emergency Medicine

## 2013-09-02 DIAGNOSIS — Z888 Allergy status to other drugs, medicaments and biological substances status: Secondary | ICD-10-CM | POA: Insufficient documentation

## 2013-09-02 DIAGNOSIS — Z7982 Long term (current) use of aspirin: Secondary | ICD-10-CM | POA: Insufficient documentation

## 2013-09-02 DIAGNOSIS — R339 Retention of urine, unspecified: Secondary | ICD-10-CM

## 2013-09-02 DIAGNOSIS — F17200 Nicotine dependence, unspecified, uncomplicated: Secondary | ICD-10-CM | POA: Insufficient documentation

## 2013-09-02 DIAGNOSIS — N390 Urinary tract infection, site not specified: Secondary | ICD-10-CM | POA: Insufficient documentation

## 2013-09-02 DIAGNOSIS — R338 Other retention of urine: Secondary | ICD-10-CM | POA: Insufficient documentation

## 2013-09-02 DIAGNOSIS — Z8546 Personal history of malignant neoplasm of prostate: Secondary | ICD-10-CM | POA: Insufficient documentation

## 2013-09-02 LAB — URINALYSIS, ROUTINE W REFLEX MICROSCOPIC
BILIRUBIN URINE: NEGATIVE
GLUCOSE, UA: NEGATIVE mg/dL
Ketones, ur: NEGATIVE mg/dL
Nitrite: NEGATIVE
PH: 6 (ref 5.0–8.0)
Protein, ur: 100 mg/dL — AB
SPECIFIC GRAVITY, URINE: 1.013 (ref 1.005–1.030)
UROBILINOGEN UA: 0.2 mg/dL (ref 0.0–1.0)

## 2013-09-02 LAB — URINE MICROSCOPIC-ADD ON

## 2013-09-02 MED ORDER — PHENAZOPYRIDINE HCL 200 MG PO TABS
200.0000 mg | ORAL_TABLET | Freq: Three times a day (TID) | ORAL | Status: DC
  Administered 2013-09-02: 200 mg via ORAL
  Filled 2013-09-02: qty 1

## 2013-09-02 MED ORDER — CEPHALEXIN 500 MG PO CAPS: 500.0000 mg | ORAL_CAPSULE | Freq: Four times a day (QID) | ORAL | Status: DC

## 2013-09-02 MED ORDER — PHENAZOPYRIDINE HCL 200 MG PO TABS: 200.0000 mg | ORAL_TABLET | Freq: Three times a day (TID) | ORAL | Status: DC

## 2013-09-02 NOTE — ED Provider Notes (Addendum)
CSN: [id number]     Arrival date & time 09/02/13  2113 History   First MD Initiated Contact with Patient 09/02/13 2141     Chief Complaint  Patient presents with  . Urinary Retention     (Consider location/radiation/quality/duration/timing/severity/associated sxs/prior Treatment) The history is provided by the patient.   patient here complaining of urinary retention since noon today. Seen here yesterday for similar symptoms and had his Foley changed. No urinalysis done that time. Denies any fever or chills. Some suprapubic pressure. His other blood in his Foley catheter tube. No treatment used prior to arrival. Nothing makes her symptoms better or worse.  Past Medical History  Diagnosis Date  . Prostate cancer   . Aortic aneurysm    Past Surgical History  Procedure Laterality Date  . Abdominal surgery     No family history on file. History  Substance Use Topics  . Smoking status: Former Research scientist (life sciences)  . Smokeless tobacco: Current User    Types: Chew  . Alcohol Use: No    Review of Systems  All other systems reviewed and are negative.     Allergies  Codeine and Tamsulosin  Home Medications   Prior to Admission medications   Medication Sig Start Date End Date Taking? Authorizing Provider  aspirin 81 MG chewable tablet Chew 81 mg by mouth daily.    Historical Provider, MD   BP 142/80  Pulse 85  Temp(Src) 98.9 F (37.2 C) (Oral)  Resp 20  SpO2 100% Physical Exam  Nursing note and vitals reviewed. Constitutional: He is oriented to person, place, and time. He appears well-developed and well-nourished.  Non-toxic appearance. No distress.  HENT:  Head: Normocephalic and atraumatic.  Eyes: Conjunctivae, EOM and lids are normal. Pupils are equal, round, and reactive to light.  Neck: Normal range of motion. Neck supple. No tracheal deviation present. No mass present.  Cardiovascular: Normal rate, regular rhythm and normal heart sounds.  Exam reveals no gallop.   No murmur  heard. Pulmonary/Chest: Effort normal and breath sounds normal. No stridor. No respiratory distress. He has no decreased breath sounds. He has no wheezes. He has no rhonchi. He has no rales.  Abdominal: Soft. Normal appearance and bowel sounds are normal. He exhibits no distension. There is no tenderness. There is no rigidity, no rebound, no guarding and no CVA tenderness.  Musculoskeletal: Normal range of motion. He exhibits no edema and no tenderness.  Neurological: He is alert and oriented to person, place, and time. He has normal strength. No cranial nerve deficit or sensory deficit. GCS eye subscore is 4. GCS verbal subscore is 5. GCS motor subscore is 6.  Skin: Skin is warm and dry. No abrasion and no rash noted.  Psychiatric: He has a normal mood and affect. His speech is normal and behavior is normal.    ED Course  Procedures (including critical care time) Labs Review Labs Reviewed  URINALYSIS, ROUTINE W REFLEX MICROSCOPIC    Imaging Review No results found.   EKG Interpretation None      MDM   Final diagnoses:  None    Foley irrigated by nursing with saline. Urinalysis sent. Patient has followup next week with urology. Will check UA UA with possible early infection will place on antibiotics.   Leota Jacobsen, MD 09/02/13 2214  Leota Jacobsen, MD 09/02/13 [phone]

## 2013-09-02 NOTE — Discharge Instructions (Signed)
Followup with your urologist next week as you have already scheduled Urinary Tract Infection Urinary tract infections (UTIs) can develop anywhere along your urinary tract. Your urinary tract is your body's drainage system for removing wastes and extra water. Your urinary tract includes two kidneys, two ureters, a bladder, and a urethra. Your kidneys are a pair of bean-shaped organs. Each kidney is about the size of your fist. They are located below your ribs, one on each side of your spine. CAUSES Infections are caused by microbes, which are microscopic organisms, including fungi, viruses, and bacteria. These organisms are so small that they can only be seen through a microscope. Bacteria are the microbes that most commonly cause UTIs. SYMPTOMS  Symptoms of UTIs may vary by age and gender of the patient and by the location of the infection. Symptoms in young women typically include a frequent and intense urge to urinate and a painful, burning feeling in the bladder or urethra during urination. Older women and men are more likely to be tired, shaky, and weak and have muscle aches and abdominal pain. A fever may mean the infection is in your kidneys. Other symptoms of a kidney infection include pain in your back or sides below the ribs, nausea, and vomiting. DIAGNOSIS To diagnose a UTI, your caregiver will ask you about your symptoms. Your caregiver also will ask to provide a urine sample. The urine sample will be tested for bacteria and white blood cells. White blood cells are made by your body to help fight infection. TREATMENT  Typically, UTIs can be treated with medication. Because most UTIs are caused by a bacterial infection, they usually can be treated with the use of antibiotics. The choice of antibiotic and length of treatment depend on your symptoms and the type of bacteria causing your infection. HOME CARE INSTRUCTIONS  If you were prescribed antibiotics, take them exactly as your caregiver  instructs you. Finish the medication even if you feel better after you have only taken some of the medication.  Drink enough water and fluids to keep your urine clear or pale yellow.  Avoid caffeine, tea, and carbonated beverages. They tend to irritate your bladder.  Empty your bladder often. Avoid holding urine for long periods of time.  Empty your bladder before and after sexual intercourse.  After a bowel movement, women should cleanse from front to back. Use each tissue only once. SEEK MEDICAL CARE IF:   You have back pain.  You develop a fever.  Your symptoms do not begin to resolve within 3 days. SEEK IMMEDIATE MEDICAL CARE IF:   You have severe back pain or lower abdominal pain.  You develop chills.  You have nausea or vomiting.  You have continued burning or discomfort with urination. MAKE SURE YOU:   Understand these instructions.  Will watch your condition.  Will get help right away if you are not doing well or get worse. Document Released: 12/23/2004 Document Revised: 09/14/2011 Document Reviewed: 04/23/2011 [facility] Patient Information 2014 [place].

## 2013-09-02 NOTE — ED Notes (Signed)
Per pt report: pt was seen yesterday for the same complaint.  Pt had his catheter changed last night.  Pt still has bloody urine and noticed that there wasn't any urine since about noon today.  Pt reports the stinging today. Pt a/o x 4.  Skin warm and dry.

## 2013-09-02 NOTE — ED Notes (Signed)
Pt states he was here last night and had a catheter placed  Pt states tonight he feels like the catheter is not draining and he is c/o a lot of burning  Irrigated catheter with 30 ml of NS  Small clots noted on return  Catheter flowing freely after irrigation  Pt states he is much more comfortable now  Family at bedside

## 2013-09-05 ENCOUNTER — Other Ambulatory Visit: Payer: Self-pay | Admitting: Urology

## 2013-09-06 NOTE — Progress Notes (Signed)
Need orders in EPIC.  Surgery on 09/20/13.  Preop on 6/16 at 0900am.  Thank You.

## 2013-09-07 NOTE — Patient Instructions (Signed)
20     Your procedure is scheduled on:  Thursday 09/20/2013  Report to [facility] Main Entrance and follow signs to Short Stay  at 1030 AM.  Call this number if you have problems the night before or morning of surgery: [phone]   Remember:             IF YOU USE CPAP,BRING MASK AND TUBING AM OF SURGERY!             IF YOU DO NOT HAVE YOUR TYPE AND SCREEN DRAWN AT PRE-ADMIT APPOINTMENT, YOU WILL HAVE IT DRAWN AM OF SURGERY!   Do not eat food or drink liquids AFTER MIDNIGHT!  Take these medicines the morning of surgery with A SIP OF WATER: none     IS NOT RESPONSIBLE FOR ANY BELONGINGS OR VALUABLES BROUGHT TO HOSPITAL.  Marland Kitchen  Leave suitcase in the car. After surgery it may be brought to your room.  For patients admitted to the hospital, checkout time is 11:00 AM the day of              Discharge.    DO NOT WEAR  JEWELRY,MAKE-UP,LOTIONS,POWDERS,PERFUMES,CONTACTS , DENTURES OR BRIDGEWORK ,AND DO NOT WEAR FALSE EYELASHES                                    Patients discharged the day of surgery will not be allowed to drive home.  If going home the same day of surgery, must have someone stay with you first 24 hrs.at home and arrange for someone to drive you home from the [place] IS:N/A   Special Instructions:              Please read over the following fact sheets that you were given:             1. George Lara     [id number]                              [facility] - Preparing for Surgery Before surgery, you can play an important role.  Because skin is not sterile, your skin needs to be as free of germs as possible.  You can reduce the number of germs on your skin by washing with CHG (chlorahexidine gluconate) soap before surgery.  CHG is an antiseptic cleaner which kills germs and bonds with the skin to  continue killing germs even after washing. Please DO NOT use if you have an allergy to CHG or antibacterial soaps.  If your skin becomes reddened/irritated stop using the CHG and inform your nurse when you arrive at Short Stay. Do not shave (including legs and underarms) for at least 48 hours prior to the first CHG shower.  You may shave your face/neck. Please follow these instructions carefully:  1.  Shower with CHG Soap the night before surgery  and the  morning of Surgery.  2.  If you choose to wash your hair, wash your hair first as usual with your  normal  shampoo.  3.  After you shampoo, rinse your hair and body thoroughly to remove the  shampoo.                           4.  Use CHG as you would any other liquid soap.  You can apply chg directly  to the skin and wash                       Gently with a scrungie or clean washcloth.  5.  Apply the CHG Soap to your body ONLY FROM THE NECK DOWN.   Do not use on face/ open                           Wound or open sores. Avoid contact with eyes, ears mouth and genitals (private parts).                       Wash face,  Genitals (private parts) with your normal soap.             6.  Wash thoroughly, paying special attention to the area where your surgery  will be performed.  7.  Thoroughly rinse your body with warm water from the neck down.  8.  DO NOT shower/wash with your normal soap after using and rinsing off  the CHG Soap.                9.  Pat yourself dry with a clean towel.            10.  Wear clean pajamas.            11.  Place clean sheets on your bed the night of your first shower and do not  sleep with pets. Day of Surgery : Do not apply any lotions/deodorants the morning of surgery.  Please wear clean clothes to the hospital/surgery center.  FAILURE TO FOLLOW THESE INSTRUCTIONS MAY RESULT IN THE CANCELLATION OF YOUR SURGERY PATIENT SIGNATURE_________________________________  NURSE  SIGNATURE__________________________________  ________________________________________________________________________   George Lara  An incentive spirometer is a tool that can help keep your lungs clear and active. This tool measures how well you are filling your lungs with each breath. Taking long deep breaths may help reverse or decrease the chance of developing breathing (pulmonary) problems (especially infection) following:  A long period of time when you are unable to move or be active. BEFORE THE PROCEDURE   If the spirometer includes an indicator to show your best effort, your nurse or respiratory therapist will set it to a desired goal.  If possible, sit up straight or lean slightly forward. Try not to slouch.  Hold the incentive spirometer in an upright position. INSTRUCTIONS FOR USE  1. Sit on the edge of your bed if possible, or sit up as far as you can in bed or on a chair. 2. Hold the incentive spirometer in an upright position. 3. Breathe out normally. 4. Place the mouthpiece in your mouth and seal your lips tightly around it. 5. Breathe in slowly and as deeply as possible, raising the piston or the ball toward the top of the column. 6. Hold your breath for 3-5 seconds or for as  long as possible. Allow the piston or ball to fall to the bottom of the column. 7. Remove the mouthpiece from your mouth and breathe out normally. 8. Rest for a few seconds and repeat Steps 1 through 7 at least 10 times every 1-2 hours when you are awake. Take your time and take a few normal breaths between deep breaths. 9. The spirometer may include an indicator to show your best effort. Use the indicator as a goal to work toward during each repetition. 10. After each set of 10 deep breaths, practice coughing to be sure your lungs are clear. If you have an incision (the cut made at the time of surgery), support your incision when coughing by placing a pillow or rolled up towels firmly  against it. Once you are able to get out of bed, walk around indoors and cough well. You may stop using the incentive spirometer when instructed by your caregiver.  RISKS AND COMPLICATIONS  Take your time so you do not get dizzy or light-headed.  If you are in pain, you may need to take or ask for pain medication before doing incentive spirometry. It is harder to take a deep breath if you are having pain. AFTER USE  Rest and breathe slowly and easily.  It can be helpful to keep track of a log of your progress. Your caregiver can provide you with a simple table to help with this. If you are using the spirometer at home, follow these instructions: [place] IF:   You are having difficultly using the spirometer.  You have trouble using the spirometer as often as instructed.  Your pain medication is not giving enough relief while using the spirometer.  You develop fever of 100.5 F (38.1 C) or higher. SEEK IMMEDIATE MEDICAL CARE IF:   You cough up bloody sputum that had not been present before.  You develop fever of 102 F (38.9 C) or greater.  You develop worsening pain at or near the incision site. MAKE SURE YOU:   Understand these instructions.  Will watch your condition.  Will get help right away if you are not doing well or get worse. Document Released: 07/26/2006 Document Revised: 06/07/2011 Document Reviewed: 09/26/2006 [employer] Patient Information 2014 [employer], [place].   ________________________________________________________________________  WHAT IS A BLOOD TRANSFUSION? Blood Transfusion Information  A transfusion is the replacement of blood or some of its parts. Blood is made up of multiple cells which provide different functions.  Red blood cells carry oxygen and are used for blood loss replacement.  White blood cells fight against infection.  Platelets control bleeding.  Plasma helps clot blood.  Other blood products are available for  specialized needs, such as hemophilia or other clotting disorders. BEFORE THE TRANSFUSION  Who gives blood for transfusions?   Healthy volunteers who are fully evaluated to make sure their blood is safe. This is blood bank blood. Transfusion therapy is the safest it has ever been in the practice of medicine. Before blood is taken from a donor, a complete history is taken to make sure that person has no history of diseases nor engages in risky social behavior (examples are intravenous drug use or sexual activity with multiple partners). The donor's travel history is screened to minimize risk of transmitting infections, such as malaria. The donated blood is tested for signs of infectious diseases, such as HIV and hepatitis. The blood is then tested to be sure it is compatible with you in order to minimize the chance  of a transfusion reaction. If you or a relative donates blood, this is often done in anticipation of surgery and is not appropriate for emergency situations. It takes many days to process the donated blood. RISKS AND COMPLICATIONS Although transfusion therapy is very safe and saves many lives, the main dangers of transfusion include:   Getting an infectious disease.  Developing a transfusion reaction. This is an allergic reaction to something in the blood you were given. Every precaution is taken to prevent this. The decision to have a blood transfusion has been considered carefully by your caregiver before blood is given. Blood is not given unless the benefits outweigh the risks. AFTER THE TRANSFUSION  Right after receiving a blood transfusion, you will usually feel much better and more energetic. This is especially true if your red blood cells have gotten low (anemic). The transfusion raises the level of the red blood cells which carry oxygen, and this usually causes an energy increase.  The nurse administering the transfusion will monitor you carefully for complications. HOME CARE  INSTRUCTIONS  No special instructions are needed after a transfusion. You may find your energy is better. Speak with your caregiver about any limitations on activity for underlying diseases you may have. SEEK MEDICAL CARE IF:   Your condition is not improving after your transfusion.  You develop redness or irritation at the intravenous (IV) site. SEEK IMMEDIATE MEDICAL CARE IF:  Any of the following symptoms occur over the next 12 hours:  Shaking chills.  You have a temperature by mouth above 102 F (38.9 C), not controlled by medicine.  Chest, back, or muscle pain.  People around you feel you are not acting correctly or are confused.  Shortness of breath or difficulty breathing.  Dizziness and fainting.  You get a rash or develop hives.  You have a decrease in urine output.  Your urine turns a dark color or changes to pink, red, or brown. Any of the following symptoms occur over the next 10 days:  You have a temperature by mouth above 102 F (38.9 C), not controlled by medicine.  Shortness of breath.  Weakness after normal activity.  The white part of the eye turns yellow (jaundice).  You have a decrease in the amount of urine or are urinating less often.  Your urine turns a dark color or changes to pink, red, or brown. Document Released: 03/12/2000 Document Revised: 06/07/2011 Document Reviewed: 10/30/2007 [facility] Patient Information 2014 [place], [place].  _______________________________________________________________________

## 2013-09-11 ENCOUNTER — Encounter (HOSPITAL_COMMUNITY): Payer: Self-pay | Admitting: Pharmacy Technician

## 2013-09-11 ENCOUNTER — Encounter (HOSPITAL_COMMUNITY): Payer: Self-pay

## 2013-09-11 ENCOUNTER — Encounter (INDEPENDENT_AMBULATORY_CARE_PROVIDER_SITE_OTHER): Payer: Self-pay

## 2013-09-11 ENCOUNTER — Encounter (HOSPITAL_COMMUNITY)
Admission: RE | Admit: 2013-09-11 | Discharge: 2013-09-11 | Disposition: A | Discharge status: Home / Self Care | Payer: Medicare Other | Source: Ambulatory Visit | Attending: Urology | Admitting: Urology

## 2013-09-11 DIAGNOSIS — Z01812 Encounter for preprocedural laboratory examination: Secondary | ICD-10-CM | POA: Insufficient documentation

## 2013-09-11 LAB — BASIC METABOLIC PANEL
BUN: 15 mg/dL (ref 6–23)
CALCIUM: 9.2 mg/dL (ref 8.4–10.5)
CO2: 27 mEq/L (ref 19–32)
Chloride: 103 mEq/L (ref 96–112)
Creatinine, Ser: 0.63 mg/dL (ref 0.50–1.35)
GFR calc Af Amer: >90 mL/min (ref >90)
GFR calc non Af Amer: 84 mL/min — ABNORMAL LOW (ref >90)
GLUCOSE: 126 mg/dL — AB (ref 70–99)
Potassium: 4.5 mEq/L (ref 3.7–5.3)
Sodium: 141 mEq/L (ref 137–147)

## 2013-09-11 LAB — CBC
HCT: 41.2 % (ref 39.0–52.0)
Hemoglobin: 13.7 g/dL (ref 13.0–17.0)
MCH: 30.2 pg (ref 26.0–34.0)
MCHC: 33.3 g/dL (ref 30.0–36.0)
MCV: 90.7 fL (ref 78.0–100.0)
PLATELETS: 151 10*3/uL (ref 150–400)
RBC: 4.54 MIL/uL (ref 4.22–5.81)
RDW: 13.6 % (ref 11.5–15.5)
WBC: 6 10*3/uL (ref 4.0–10.5)

## 2013-09-20 ENCOUNTER — Encounter (HOSPITAL_COMMUNITY)
Admission: RE | Disposition: A | Discharge status: Home / Self Care | Payer: Self-pay | Source: Ambulatory Visit | Attending: Urology

## 2013-09-20 ENCOUNTER — Encounter (HOSPITAL_COMMUNITY): Payer: Self-pay | Admitting: *Deleted

## 2013-09-20 ENCOUNTER — Ambulatory Visit (HOSPITAL_COMMUNITY)
Admission: RE | Admit: 2013-09-20 | Discharge: 2013-09-21 | Disposition: A | Discharge status: Home / Self Care | Payer: Medicare Other | Source: Ambulatory Visit | Attending: Urology | Admitting: Urology

## 2013-09-20 ENCOUNTER — Ambulatory Visit (HOSPITAL_COMMUNITY): Payer: Medicare Other | Admitting: Certified Registered Nurse Anesthetist

## 2013-09-20 ENCOUNTER — Encounter (HOSPITAL_COMMUNITY): Payer: Medicare Other | Admitting: Certified Registered Nurse Anesthetist

## 2013-09-20 DIAGNOSIS — Z87891 Personal history of nicotine dependence: Secondary | ICD-10-CM | POA: Insufficient documentation

## 2013-09-20 DIAGNOSIS — I739 Peripheral vascular disease, unspecified: Secondary | ICD-10-CM | POA: Insufficient documentation

## 2013-09-20 DIAGNOSIS — Z7982 Long term (current) use of aspirin: Secondary | ICD-10-CM | POA: Insufficient documentation

## 2013-09-20 DIAGNOSIS — I714 Abdominal aortic aneurysm, without rupture, unspecified: Secondary | ICD-10-CM | POA: Insufficient documentation

## 2013-09-20 DIAGNOSIS — C61 Malignant neoplasm of prostate: Secondary | ICD-10-CM | POA: Insufficient documentation

## 2013-09-20 DIAGNOSIS — N320 Bladder-neck obstruction: Secondary | ICD-10-CM | POA: Insufficient documentation

## 2013-09-20 HISTORY — PX: TRANSURETHRAL RESECTION OF PROSTATE: SHX73

## 2013-09-20 SURGERY — TRANSURETHRAL RESECTION OF THE PROSTATE WITH GYRUS INSTRUMENTS
Anesthesia: General

## 2013-09-20 MED ORDER — PROMETHAZINE HCL 25 MG/ML IJ SOLN: 6.2500 mg | INTRAMUSCULAR | Status: DC | PRN

## 2013-09-20 MED ORDER — SODIUM CHLORIDE 0.9 % IR SOLN
Status: DC | PRN
  Administered 2013-09-20: 6000 mL

## 2013-09-20 MED ORDER — LIDOCAINE HCL (CARDIAC) 20 MG/ML IV SOLN
INTRAVENOUS | Status: DC | PRN
  Administered 2013-09-20: 100 mg via INTRAVENOUS

## 2013-09-20 MED ORDER — FENTANYL CITRATE 0.05 MG/ML IJ SOLN
INTRAMUSCULAR | Status: AC
  Filled 2013-09-20: qty 2

## 2013-09-20 MED ORDER — CIPROFLOXACIN IN D5W 400 MG/200ML IV SOLN
400.0000 mg | INTRAVENOUS | Status: AC
  Administered 2013-09-20: 400 mg via INTRAVENOUS

## 2013-09-20 MED ORDER — ONDANSETRON HCL 4 MG/2ML IJ SOLN: 4.0000 mg | INTRAMUSCULAR | Status: DC | PRN

## 2013-09-20 MED ORDER — ONDANSETRON HCL 4 MG/2ML IJ SOLN
INTRAMUSCULAR | Status: DC | PRN
  Administered 2013-09-20: 4 mg via INTRAVENOUS

## 2013-09-20 MED ORDER — ONDANSETRON HCL 4 MG/2ML IJ SOLN
INTRAMUSCULAR | Status: AC
  Filled 2013-09-20: qty 2

## 2013-09-20 MED ORDER — PROPOFOL 10 MG/ML IV BOLUS
INTRAVENOUS | Status: DC | PRN
  Administered 2013-09-20: 130 mg via INTRAVENOUS

## 2013-09-20 MED ORDER — EPHEDRINE SULFATE 50 MG/ML IJ SOLN
INTRAMUSCULAR | Status: DC | PRN
  Administered 2013-09-20 (×2): 10 mg via INTRAVENOUS

## 2013-09-20 MED ORDER — PROPOFOL 10 MG/ML IV BOLUS
INTRAVENOUS | Status: AC
  Filled 2013-09-20: qty 20

## 2013-09-20 MED ORDER — ACETAMINOPHEN 325 MG PO TABS: 650.0000 mg | ORAL_TABLET | ORAL | Status: DC | PRN

## 2013-09-20 MED ORDER — LACTATED RINGERS IV SOLN
INTRAVENOUS | Status: DC
Start: 2013-09-20 — End: 2013-09-20
  Administered 2013-09-20: 1000 mL via INTRAVENOUS

## 2013-09-20 MED ORDER — LIDOCAINE HCL (CARDIAC) 20 MG/ML IV SOLN
INTRAVENOUS | Status: AC
  Filled 2013-09-20: qty 5

## 2013-09-20 MED ORDER — DOCUSATE SODIUM 100 MG PO CAPS
100.0000 mg | ORAL_CAPSULE | Freq: Two times a day (BID) | ORAL | Status: DC
  Administered 2013-09-20 – 2013-09-21 (×2): 100 mg via ORAL
  Filled 2013-09-20 (×3): qty 1

## 2013-09-20 MED ORDER — FENTANYL CITRATE 0.05 MG/ML IJ SOLN
INTRAMUSCULAR | Status: DC | PRN
  Administered 2013-09-20 (×2): 25 ug via INTRAVENOUS

## 2013-09-20 MED ORDER — CIPROFLOXACIN IN D5W 400 MG/200ML IV SOLN
INTRAVENOUS | Status: AC
  Filled 2013-09-20: qty 200

## 2013-09-20 MED ORDER — 0.9 % SODIUM CHLORIDE (POUR BTL) OPTIME
TOPICAL | Status: DC | PRN
  Administered 2013-09-20: 1000 mL

## 2013-09-20 MED ORDER — SULFAMETHOXAZOLE-TMP DS 800-160 MG PO TABS: 1.0000 | ORAL_TABLET | Freq: Two times a day (BID) | ORAL | Status: DC

## 2013-09-20 MED ORDER — SULFAMETHOXAZOLE-TMP DS 800-160 MG PO TABS
1.0000 | ORAL_TABLET | Freq: Two times a day (BID) | ORAL | Status: DC
  Administered 2013-09-20 – 2013-09-21 (×2): 1 via ORAL
  Filled 2013-09-20 (×3): qty 1

## 2013-09-20 MED ORDER — OXYBUTYNIN CHLORIDE 5 MG PO TABS
5.0000 mg | ORAL_TABLET | Freq: Three times a day (TID) | ORAL | Status: DC | PRN
  Filled 2013-09-20: qty 1

## 2013-09-20 MED ORDER — SODIUM CHLORIDE 0.45 % IV SOLN
INTRAVENOUS | Status: DC
  Administered 2013-09-20 – 2013-09-21 (×2): via INTRAVENOUS

## 2013-09-20 MED ORDER — DOCUSATE SODIUM 100 MG PO CAPS: 100.0000 mg | ORAL_CAPSULE | Freq: Two times a day (BID) | ORAL | Status: DC

## 2013-09-20 MED ORDER — FENTANYL CITRATE 0.05 MG/ML IJ SOLN: 25.0000 ug | INTRAMUSCULAR | Status: DC | PRN

## 2013-09-20 MED ORDER — MEPERIDINE HCL 50 MG/ML IJ SOLN: 6.2500 mg | INTRAMUSCULAR | Status: DC | PRN

## 2013-09-20 SURGICAL SUPPLY — 18 items
BAG URINE DRAINAGE (UROLOGICAL SUPPLIES) ×3 IMPLANT
BAG URO CATCHER STRL LF (DRAPE) ×3 IMPLANT
CATH FOLEY 3WAY 30CC 22FR (CATHETERS) ×3 IMPLANT
DRAPE CAMERA CLOSED 9X96 (DRAPES) ×3 IMPLANT
ELECT BUTTON HF 24-28F 2 30DE (ELECTRODE) ×1 IMPLANT
ELECT HF RESECT BIPO 24F 45 ND (CUTTING LOOP) ×1 IMPLANT
ELECT LOOP MED HF 24F 12D (CUTTING LOOP) ×3 IMPLANT
EVACUATOR MICROVAS BLADDER (UROLOGICAL SUPPLIES) ×3 IMPLANT
GLOVE BIOGEL M 8.0 STRL (GLOVE) ×3 IMPLANT
GOWN STRL REUS W/TWL XL LVL3 (GOWN DISPOSABLE) ×3 IMPLANT
HOLDER FOLEY CATH W/STRAP (MISCELLANEOUS) ×2 IMPLANT
IV NS IRRIG 3000ML ARTHROMATIC (IV SOLUTION) ×4 IMPLANT
KIT ASPIRATION TUBING (SET/KITS/TRAYS/PACK) ×3 IMPLANT
MANIFOLD NEPTUNE II (INSTRUMENTS) ×3 IMPLANT
PACK CYSTO (CUSTOM PROCEDURE TRAY) ×3 IMPLANT
SYR 30ML LL (SYRINGE) ×2 IMPLANT
TUBING CONNECTING 10 (TUBING) ×2 IMPLANT
TUBING CONNECTING 10' (TUBING) ×1

## 2013-09-20 NOTE — H&P (Signed)
  Urology History and Physical Exam  CC: Inability urinate  HPI: 80 year old male with persistent/progressive prostate cancer with bladder outlet obstruction presents at this time for TURP for palliation and to obviate the need for catheter.  PMH: Past Medical History  Diagnosis Date  . Prostate cancer   . Aortic aneurysm     PSH: Past Surgical History  Procedure Laterality Date  . Abdominal surgery    . Hernia repair      double hernia    Allergies: Allergies  Allergen Reactions  . Codeine Nausea And Vomiting  . Tamsulosin Other (See Comments)    "Passed out"    Medications: Prescriptions prior to admission  Medication Sig Dispense Refill  . aspirin EC 81 MG tablet Take 81 mg by mouth daily.      . cephALEXin (KEFLEX) 500 MG capsule Take 500 mg by mouth 3 (three) times daily.         Social History: History   Social History  . Marital Status: Widowed    Spouse Name: N/A    Number of Children: N/A  . Years of Education: N/A   Occupational History  . Not on file.   Social History Main Topics  . Smoking status: Former Smoker    Quit date: 10/07/1988  . Smokeless tobacco: Current User    Types: Chew  . Alcohol Use: No  . Drug Use: No  . Sexual Activity: Not on file   Other Topics Concern  . Not on file   Social History Narrative  . No narrative on file    Family History: History reviewed. No pertinent family history.  Review of Systems: Positive:  Urinary retention Negativ  A further 10 point review of systems was negative except what is listed in the HPI.                  Physical Exam: @VITALS2 @ General: No acute distress.  Awake. Head:  Normocephalic.  Atraumatic. ENT:  EOMI.  Mucous membranes moist Neck:  Supple.  No lymphadenopathy. CV:  S1 present. S2 present. Regular rate. Pulmonary: Equal effort bilaterally.  Clear to auscultation bilaterally. Abdomen: Soft.   Non-tender to palpation. Skin:  Normal turgor.  No visible  rash. Extremity: No gross deformity of bilateral upper extremities.  No gross deformity of                             lower extremities. Neurologic: Alert. Appropriate mood.   Studies:  No results found for this basename: HGB, WBC, PLT,  in the last 72 hours  No results found for this basename: NA, K, CL, CO2, BUN, CREATININE, CALCIUM, MAGNESIUM, GFRNONAA, GFRAA,  in the last 72 hours   No results found for this basename: PT, INR, APTT,  in the last 72 hours   No components found with this basename: ABG,     Assessment:   Urinary retention secondary to prostate cancer with obstruction  Plan:  TURP

## 2013-09-20 NOTE — Op Note (Signed)
Preoperative diagnosis: 1. Bladder outlet obstruction secondary to prostate cancer  Postoperative diagnosis:  1. Same  Procedure:  1. Cystoscopy 2. Transurethral resection of the prostate  Surgeon: Lillette Boxer. Dahlstedt, M.D.  Anesthesia: General  Complications: None  Drain: Foley catheter-22 [place], three-way  EBL: Minimal  Specimens: 1. Prostate chips  Disposition of specimens: Pathology  Indication: George Lara is a patient with bladder outlet obstruction secondary to locally advanced prostate cancer who. After reviewing the management options for treatment, he elected to proceed with the above surgical procedure(s). We have discussed the potential benefits and risks of the procedure, side effects of the proposed treatment, the likelihood of the patient achieving the goals of the procedure, and any potential problems that might occur during the procedure or recuperation. Informed consent has been obtained.  Description of procedure:  The patient was identified in the holding area.He received preoperative antibiotics. He was then taken to the operating room. General anesthetic was administered.  The patient was then placed in the dorsal lithotomy position, prepped and draped in the usual sterile fashion. Timeout was then performed.  A resectoscope sheath was placed using the obturator, and the resectoscope, loop and telescope were placed.  The bladder was then systematically examined in its entirety. There was no evidence of  tumors, stones, , but there was edema posteriorly in the midline of the bladder, most likely secondary to long-term catheter placement.  The ureteral orifices were identified and marked so as to be avoided during the procedure.  The obstructing prostate tissue was then resected utilizing loop cautery resection with the monopolar/bipolar cutting loop.  The prostate adenoma from the bladder neck back to the verumontanum was resected beginning at the  six o'clock position and then extended to include the right and left lobes of the prostate and anterior prostate, respectively. Care was taken not to resect distal to the verumontanum.  Hemostasis was then achieved with the cautery and the bladder was emptied and reinspected with no significant bleeding noted at the end of the procedure.  Resected chips were irrigated from the bladder with the evacuator and sent to pathology.  A 3 way catheter was then placed into the bladder and placed on continuous bladder irrigation.  The patient appeared to tolerate the procedure well and without complications. The patient was able to be awakened and transferred to the recovery unit in satisfactory condition. He tolerated the procedure well.

## 2013-09-20 NOTE — Anesthesia Preprocedure Evaluation (Signed)
Anesthesia Evaluation  Patient identified by MRN, date of birth, ID band Patient awake    Reviewed: Allergy & Precautions, H&P , NPO status , Patient's Chart, lab work & pertinent test results  Airway Mallampati: II TM Distance: >3 FB Neck ROM: Full    Dental no notable dental hx. (+) Edentulous Upper, Edentulous Lower   Pulmonary neg pulmonary ROS, former smoker,  breath sounds clear to auscultation  Pulmonary exam normal       Cardiovascular + Peripheral Vascular Disease (AAA) negative cardio ROS  Rhythm:Regular Rate:Normal     Neuro/Psych negative neurological ROS  negative psych ROS   GI/Hepatic negative GI ROS, Neg liver ROS,   Endo/Other  negative endocrine ROS  Renal/GU negative Renal ROS  negative genitourinary   Musculoskeletal negative musculoskeletal ROS (+)   Abdominal   Peds negative pediatric ROS (+)  Hematology negative hematology ROS (+)   Anesthesia Other Findings   Reproductive/Obstetrics negative OB ROS                           Anesthesia Physical Anesthesia Plan  ASA: II  Anesthesia Plan: General   Post-op Pain Management:    Induction: Intravenous  Airway Management Planned: LMA  Additional Equipment:   Intra-op Plan:   Post-operative Plan: Extubation in OR  Informed Consent: I have reviewed the patients History and Physical, chart, labs and discussed the procedure including the risks, benefits and alternatives for the proposed anesthesia with the patient or authorized representative who has indicated his/her understanding and acceptance.   Dental advisory given  Plan Discussed with: CRNA  Anesthesia Plan Comments:         Anesthesia Quick Evaluation

## 2013-09-20 NOTE — Transfer of Care (Signed)
Immediate Anesthesia Transfer of Care Note  Patient: George Lara  Procedure(s) Performed: Procedure(s) (LRB): TRANSURETHRAL RESECTION OF THE PROSTATE  (N/A)  Patient Location: PACU  Anesthesia Type: General  Level of Consciousness: sedated, patient cooperative and responds to stimulation  Airway & Oxygen Therapy: Patient Spontanous Breathing and Patient connected to face mask oxgen  Post-op Assessment: Report given to PACU RN and Post -op Vital signs reviewed and stable  Post vital signs: Reviewed and stable  Complications: No apparent anesthesia complications

## 2013-09-20 NOTE — Anesthesia Postprocedure Evaluation (Signed)
  Anesthesia Post-op Note  Patient: George Lara  Procedure(s) Performed: Procedure(s) (LRB): TRANSURETHRAL RESECTION OF THE PROSTATE  (N/A)  Patient Location: PACU  Anesthesia Type: General  Level of Consciousness: awake and alert   Airway and Oxygen Therapy: Patient Spontanous Breathing  Post-op Pain: mild  Post-op Assessment: Post-op Vital signs reviewed, Patient's Cardiovascular Status Stable, Respiratory Function Stable, Patent Airway and No signs of Nausea or vomiting  Last Vitals:  Filed Vitals:   09/20/13 1405  BP: 155/80  Pulse: 77  Temp: 36.3 C  Resp: 16    Post-op Vital Signs: stable   Complications: No apparent anesthesia complications

## 2013-09-20 NOTE — Discharge Instructions (Signed)
Transurethral Resection of the Prostate ° °Care After ° °Refer to this sheet in the next few weeks. These discharge instructions provide you with general information on caring for yourself after you leave the hospital. Your caregiver may also give you specific instructions. Your treatment has been planned according to the most current medical practices available, but unavoidable complications sometimes occur. If you have any problems or questions after discharge, please call your caregiver. ° °HOME CARE INSTRUCTIONS  ° °Medications °· You may receive medicine for pain management. As your level of discomfort decreases, adjustments in your pain medicines may be made.  °· Take all medicines as directed.  °· You may be given a medicine (antibiotic) to kill germs following surgery. Finish all medicines. Let your caregiver know if you have any side effects or problems from the medicine.  °· If you are on aspirin, it would be best not to restart the aspirin until the blood in the urine clears °Hygiene °· You can take a shower after surgery.  °· You should not take a bath while you still have the urethral catheter. °Activity °· You will be encouraged to get out of bed as much as possible and increase your activity level as tolerated.  °· Spend the first week in and around your home. For 3 weeks, avoid the following:  °· Straining.  °· Running.  °· Strenuous work.  °· Walks longer than a few blocks.  °· Riding for extended periods.  °· Sexual relations.  °· Do not lift heavy objects (more than 20 pounds) for at least 1 month. When lifting, use your arms instead of your abdominal muscles.  °· You will be encouraged to walk as tolerated. Do not exert yourself. Increase your activity level slowly. Remember that it is important to keep moving after an operation of any type. This cuts down on the possibility of developing blood clots.  °· Your caregiver will tell you when you can resume driving and light housework. Discuss this  at your first office visit after discharge. °Diet °· No special diet is ordered after a TURP. However, if you are on a special diet for another medical problem, it should be continued.  °· Normal fluid intake is usually recommended.  °· Avoid alcohol and caffeinated drinks for 2 weeks. They irritate the bladder. Decaffeinated drinks are okay.  °· Avoid spicy foods.  °Bladder Function °· For the first 10 days, empty the bladder whenever you feel a definite desire. Do not try to hold the urine for long periods of time.  °· Urinating once or twice a night even after you are healed is not uncommon.  °· You may see some recurrence of blood in the urine after discharge from the hospital. This usually happens within 2 weeks after the procedure.If this occurs, force fluids again as you did in the hospital and reduce your activity.  °Bowel Function °· You may experience some constipation after surgery. This can be minimized by increasing fluids and fiber in your diet. Drink enough water and fluids to keep your urine clear or pale yellow.  °· A stool softener may be prescribed for use at home. Do not strain to move your bowels.  °· If you are requiring increased pain medicine, it is important that you take stool softeners to prevent constipation. This will help to promote proper healing by reducing the need to strain to move your bowels.  °Sexual Activity °· Semen movement in the opposite direction and into the bladder (  retrograde ejaculation) may occur. Since the semen passes into the bladder, cloudy urine can occur the first time you urinate after intercourse. Or, you may not have an ejaculation during erection. Ask your caregiver when you can resume sexual activity. Retrograde ejaculation and reduced semen discharge should not reduce one's pleasure of intercourse.  °Postoperative Visit °· Arrange the date and time of your after surgery visit with your caregiver.  °Return to Work °· After your recovery is complete, you will  be able to return to work and resume all activities. Your caregiver will inform you when you can return to work.  °Foley Catheter Care °A soft, flexible tube (Foley catheter) may have been placed in your bladder to drain urine and fluid. Follow these instructions: °Taking Care of the Catheter °· Keep the area where the catheter leaves your body clean.  °· Attach the catheter to the leg so there is no tension on the catheter.  °· Keep the drainage bag below the level of the bladder, but keep it OFF the floor.  °· Do not take long soaking baths. Your caregiver will give instructions about showering.  °· Wash your hands before touching ANYTHING related to the catheter or bag.  °· Using mild soap and warm water on a washcloth:  °· Clean the area closest to the catheter insertion site using a circular motion around the catheter.  °· Clean the catheter itself by wiping AWAY from the insertion site for several inches down the tube.  °· NEVER wipe upward as this could sweep bacteria up into the urethra (tube in your body that normally drains the bladder) and cause infection.  °· Place a small amount of sterile lubricant at the tip of the penis where the catheter is entering.  °Taking Care of the Drainage Bags °· Two drainage bags may be taken home: a large overnight drainage bag, and a smaller leg bag which fits underneath clothing.  °· It is okay to wear the overnight bag at any time, but NEVER wear the smaller leg bag at night.  °· Keep the drainage bag well below the level of your bladder. This prevents backflow of urine into the bladder and allows the urine to drain freely.  °· Anchor the tubing to your leg to prevent pulling or tension on the catheter. Use tape or a leg strap provided by the hospital.  °· Empty the drainage bag when it is 1/2 to 3/4 full. Wash your hands before and after touching the bag.  °· Periodically check the tubing for kinks to make sure there is no pressure on the tubing which could restrict  the flow of urine.  °Changing the Drainage Bags °· Cleanse both ends of the clean bag with alcohol before changing.  °· Pinch off the rubber catheter to avoid urine spillage during the disconnection.  °· Disconnect the dirty bag and connect the clean one.  °· Empty the dirty bag carefully to avoid a urine spill.  °· Attach the new bag to the leg with tape or a leg strap.  °Cleaning the Drainage Bags °· Whenever a drainage bag is disconnected, it must be cleaned quickly so it is ready for the next use.  °· Wash the bag in warm, soapy water.  °· Rinse the bag thoroughly with warm water.  °· Soak the bag for 30 minutes in a solution of white vinegar and water (1 cup vinegar to 1 quart warm water).  °· Rinse with warm water.  °SEEK MEDICAL   CARE IF:  °· You have chills or night sweats.  °· You are leaking around your catheter or have problems with your catheter. It is not uncommon to have sporadic leakage around your catheter as a result of bladder spasms. If the leakage stops, there is not much need for concern. If you are uncertain, call your caregiver.  °· You develop side effects that you think are coming from your medicines.  °SEEK IMMEDIATE MEDICAL CARE IF:  °· You are suddenly unable to urinate. Check to see if there are any kinks in the drainage tubing that may cause this. If you cannot find any kinks, call your caregiver immediately. This is an emergency.  °· You develop shortness of breath or chest pains.  °· Bleeding persists or clots develop in your urine.  °· You have a fever.  °· You develop pain in your back or over your lower belly (abdomen).  °· You develop pain or swelling in your legs.  °· Any problems you are having get worse rather than better.  °MAKE SURE YOU:  °· Understand these instructions.  °· Will watch your condition.  °· Will get help right away if you are not doing well or get worse.  °Document Released: 03/15/2005 Document Revised: 11/25/2010 Document Reviewed: 11/06/2008 °[employer]®  Patient Information ©2012 [employer].Transurethral Resection of the Prostate °Care After °Refer to this sheet in the next few weeks. These discharge instructions provide you with general information on caring for yourself after you leave the hospital. Your caregiver may also give you specific instructions. Your treatment has been planned according to the most current medical practices available, but unavoidable complications sometimes occur. If you have any problems or questions after discharge, please call your caregiver. °

## 2013-09-21 ENCOUNTER — Encounter (HOSPITAL_COMMUNITY): Payer: Self-pay | Admitting: Urology

## 2013-09-21 MED ORDER — DSS 100 MG PO CAPS: 100.0000 mg | ORAL_CAPSULE | Freq: Two times a day (BID) | ORAL | Status: DC

## 2013-09-21 MED ORDER — BETHANECHOL CHLORIDE 25 MG PO TABS: 25.0000 mg | ORAL_TABLET | Freq: Three times a day (TID) | ORAL | Status: DC

## 2013-09-21 NOTE — Progress Notes (Signed)
Bladder scan done with 224cc, pt still having continoius urine leakage.

## 2013-09-21 NOTE — Progress Notes (Signed)
Pt just changed his diaper that was wet with urine & did urinate in BR per family. Bladder scan done with 430cc residual.

## 2013-09-21 NOTE — Progress Notes (Signed)
20 Fr Coude foley catheter inserted per MD order. Foley inserted with ease, red urine with a few small clots returned. Pt tolerated procedure well. Pt to be d/ced home with foley catheter d/t urinary retention. Pt to f/u in MD office on Wednesday for voiding trial. MD office will call pt's daughter with appointment.   Discharge teaching completed with patient and his family at bedside. Demonstrated how to change from a large bag to a leg bag. Discussed catheter care and when to wear each bag. Encouraged pt to wear large bag while sleeping at night, as he reported that prior to admit he had been wearing the leg bag at night and getting up about two times to empty it. Reviewed discharge instructions and medication with pt and family. They all verbalized understanding and had no questions. Pt discharged to home with his family to transport him.

## 2013-09-21 NOTE — Progress Notes (Signed)
1 Day Post-Op Subjective: Patient reports that he is in no pain. He is having urgency and uncontrolled leakage.  Objective: Vital signs in last 24 hours: Temp:  [97.4 F (36.3 C)-98.4 F (36.9 C)] 97.7 F (36.5 C) (06/26 0427) Pulse Rate:  [66-79] 73 (06/26 0427) Resp:  [11-20] 20 (06/26 0427) BP: (100-177)/(54-90) 124/82 mmHg (06/26 0427) SpO2:  [95 %-100 %] 95 % (06/26 0427) Weight:  [68.493 kg (151 lb)] 68.493 kg (151 lb) (06/25 1418)  Intake/Output from previous day: 06/25 0701 - 06/26 0700 In: 5556.3 [P.O.:600; I.V.:1456.3] Out: 3845 [Urine:5470] Intake/Output this shift: Total I/O In: -  Out: 200 [Urine:200]  Physical Exam:  Constitutional: Vital signs reviewed. WD WN in NAD   Eyes: PERRL, No scleral icterus.   Pulmonary/Chest: Normal effort Extremities: No cyanosis or edema   Urine is clear, pinkish red. No clots. Lab Results: No results found for this basename: HGB, HCT,  in the last 72 hours BMET No results found for this basename: NA, K, CL, CO2, GLUCOSE, BUN, CREATININE, CALCIUM,  in the last 72 hours No results found for this basename: LABPT, INR,  in the last 72 hours No results found for this basename: LABURIN,  in the last 72 hours Results for orders placed during the hospital encounter of 07/12/13  URINE CULTURE     Status: None   Collection Time    07/12/13 10:07 PM      Result Value Ref Range Status   Specimen Description URINE, CATHETERIZED   Final   Special Requests Normal   Final   Culture  Setup Time     Final   Value: 07/13/2013 04:30     Performed at [facility]     Final   Value: >=100,000 COLONIES/ML     Performed at [facility]     Final   Value: ENTEROCOCCUS SPECIES     KLUYVERA CRYOCRESCENS     Performed at [facility]   Report Status 07/15/2013 FINAL   Final   Organism ID, Bacteria ENTEROCOCCUS SPECIES   Final   Organism ID, Bacteria KLUYVERA CRYOCRESCENS   Final     Studies/Results: No results found.  Assessment/Plan:   Postoperative day #1 TURP for obstructive prostate cancer. Urine is fairly clear, but he is having a fair amount of leakage.    I'll have his residual urine volume check. I reassured him that the urgency and incontinence should get better. I'll see him later this morning for possible discharge.   LOS: 1 day   Franchot Gallo M 09/21/2013, 7:45 AM

## 2013-11-02 ENCOUNTER — Emergency Department (HOSPITAL_COMMUNITY)
Admission: EM | Admit: 2013-11-02 | Discharge: 2013-11-02 | Disposition: A | Discharge status: Home / Self Care | Payer: Medicare Other | Attending: Emergency Medicine | Admitting: Emergency Medicine

## 2013-11-02 ENCOUNTER — Encounter (HOSPITAL_COMMUNITY): Payer: Self-pay | Admitting: Emergency Medicine

## 2013-11-02 DIAGNOSIS — R109 Unspecified abdominal pain: Secondary | ICD-10-CM | POA: Insufficient documentation

## 2013-11-02 DIAGNOSIS — Z8546 Personal history of malignant neoplasm of prostate: Secondary | ICD-10-CM | POA: Diagnosis not present

## 2013-11-02 DIAGNOSIS — Z79899 Other long term (current) drug therapy: Secondary | ICD-10-CM | POA: Diagnosis not present

## 2013-11-02 DIAGNOSIS — R339 Retention of urine, unspecified: Secondary | ICD-10-CM | POA: Insufficient documentation

## 2013-11-02 DIAGNOSIS — Z8679 Personal history of other diseases of the circulatory system: Secondary | ICD-10-CM | POA: Insufficient documentation

## 2013-11-02 DIAGNOSIS — Z87891 Personal history of nicotine dependence: Secondary | ICD-10-CM | POA: Diagnosis not present

## 2013-11-02 DIAGNOSIS — Z792 Long term (current) use of antibiotics: Secondary | ICD-10-CM | POA: Insufficient documentation

## 2013-11-02 NOTE — ED Provider Notes (Signed)
CSN: [id number]     Arrival date & time 11/02/13  2014 History   First MD Initiated Contact with Patient 11/02/13 2045     Chief Complaint  Patient presents with  . Urinary Retention     (Consider location/radiation/quality/duration/timing/severity/associated sxs/prior Treatment) Patient is a 80 y.o. male presenting with male genitourinary complaint. The history is provided by the patient.  Male GU Problem Presenting symptoms: no dysuria   Presenting symptoms comment:  Urinary retention. Context: spontaneously   Relieved by:  Nothing Worsened by:  Nothing tried Ineffective treatments:  None tried Associated symptoms: abdominal pain   Associated symptoms: no diarrhea, no fever, no hematuria, no nausea and no vomiting   Abdominal pain:    Location:  Suprapubic   Quality:  Pressure   Severity:  Mild   Onset quality:  Gradual   Duration:  6 hours   Timing:  Constant   Progression:  Worsening   Chronicity:  New Risk factors: urinary catheter     Past Medical History  Diagnosis Date  . Prostate cancer   . Aortic aneurysm    Past Surgical History  Procedure Laterality Date  . Abdominal surgery    . Hernia repair      double hernia  . Transurethral resection of prostate N/A 09/20/2013    Procedure: TRANSURETHRAL RESECTION OF THE PROSTATE ;  Surgeon: Jorja Loa, MD;  Location: [facility];  Service: Urology;  Laterality: N/A;   No family history on file. History  Substance Use Topics  . Smoking status: Former Smoker    Quit date: 10/07/1988  . Smokeless tobacco: Current User    Types: Chew  . Alcohol Use: No    Review of Systems  Constitutional: Negative for fever.  HENT: Negative for drooling and rhinorrhea.   Eyes: Negative for pain.  Respiratory: Negative for cough and shortness of breath.   Cardiovascular: Negative for chest pain and leg swelling.  Gastrointestinal: Positive for abdominal pain. Negative for nausea, vomiting and diarrhea.  Genitourinary:  Positive for decreased urine volume. Negative for dysuria and hematuria.  Musculoskeletal: Negative for gait problem and neck pain.  Skin: Negative for color change.  Neurological: Negative for numbness and headaches.  Hematological: Negative for adenopathy.  Psychiatric/Behavioral: Negative for behavioral problems.  All other systems reviewed and are negative.     Allergies  Codeine and Tamsulosin  Home Medications   Prior to Admission medications   Medication Sig Start Date End Date Taking? Authorizing Provider  bethanechol (URECHOLINE) 25 MG tablet Take 1 tablet (25 mg total) by mouth 3 (three) times daily. 09/21/13   Jorja Loa, MD  docusate sodium 100 MG CAPS Take 100 mg by mouth 2 (two) times daily. 09/21/13   Jorja Loa, MD  sulfamethoxazole-trimethoprim (BACTRIM DS) 800-160 MG per tablet Take 1 tablet by mouth 2 (two) times daily. 09/20/13   Jorja Loa, MD   BP 167/80  Pulse 96  Temp(Src) 97.9 F (36.6 C) (Oral)  Resp 22  SpO2 98% Physical Exam  Nursing note and vitals reviewed. Constitutional: He is oriented to person, place, and time. He appears well-developed and well-nourished.  HENT:  Head: Normocephalic and atraumatic.  Right Ear: External ear normal.  Left Ear: External ear normal.  Nose: Nose normal.  Mouth/Throat: Oropharynx is clear and moist. No oropharyngeal exudate.  Eyes: Conjunctivae and EOM are normal. Pupils are equal, round, and reactive to light.  Neck: Normal range of motion. Neck supple.  Cardiovascular: Normal rate, regular  rhythm, normal heart sounds and intact distal pulses.  Exam reveals no gallop and no friction rub.   No murmur heard. Pulmonary/Chest: Effort normal and breath sounds normal. No respiratory distress. He has no wheezes.  Abdominal: Soft. Bowel sounds are normal. He exhibits distension. There is tenderness. There is no rebound and no guarding.  Mild distention and tenderness to palpation of the  suprapubic area.  Musculoskeletal: Normal range of motion. He exhibits no edema and no tenderness.  Neurological: He is alert and oriented to person, place, and time.  Skin: Skin is warm and dry.  Psychiatric: He has a normal mood and affect. His behavior is normal.    ED Course  Procedures (including critical care time) Labs Review  Imaging Review No results found.   EKG Interpretation None      MDM   Final diagnoses:  Urinary retention    9:03 PM 80 y.o. male w a hx of prostate cancer and urinary retention who pw urinary retention and suprapubic pressure. His daughter states that he had a transurethral resection of the prostate in June. She states that he had been urinating normally since that time. He began having difficulty urinating again 4 days ago and was seen in urology clinic. She states that he had a I/O cath at that time. She states that he returned the next day with continued urinary retention and a Foley catheter was placed and urinalysis sent. The patient was started on doxycycline for a urinary tract infection. He states that several hours ago he began having suprapubic pressure and felt that the catheter was not draining. He presents now suprapubic pressure and the sensation that he has to urinate. The catheter was flushed at the bedside with return of urine. He had complete symptomatic relief with this. Will change the catheter bag. I suspect possibly some sediment was obstructing the Foley. The urine otherwise appears yellowish and clear. Do not think that labwork is necessary as he has had complete resolution of his symptoms. Will have him followup with his urologist next as scheduled.  9:17 PM:  I have discussed the diagnosis/risks/treatment options with the patient and daughter and believe the pt to be eligible for discharge home to follow-up with Dr. Diona Fanti next week as scheduled. We also discussed returning to the ED immediately if new or worsening sx occur. We  discussed the sx which are most concerning (e.g., return of pain, concern for obst of the catheter) that necessitate immediate return. Medications administered to the patient during their visit and any new prescriptions provided to the patient are listed below.  Medications given during this visit Medications - No data to display  New Prescriptions   No medications on file       Pamella Pert, MD 11/02/13 2123

## 2013-11-02 NOTE — ED Notes (Signed)
Pt's daughter reports pt had a procedure for enlarged prostate in June.  Went to see his urologist Monday and Tuesday for urinary retention.  Had a foley catheter inserted Tuesday and is being treated for UTI.  Pt reports the catheter is not draining and is having pain.

## 2013-11-02 NOTE — ED Notes (Signed)
Irrigated foley with sterile water and attached a new leg bag. Patient is draining urine without any problem. Patient is not in pain or any discomfort.

## 2013-11-17 ENCOUNTER — Encounter (HOSPITAL_COMMUNITY): Payer: Self-pay | Admitting: Emergency Medicine

## 2013-11-17 ENCOUNTER — Emergency Department (HOSPITAL_COMMUNITY)
Admission: EM | Admit: 2013-11-17 | Discharge: 2013-11-17 | Disposition: A | Discharge status: Home / Self Care | Payer: Medicare Other | Attending: Emergency Medicine | Admitting: Emergency Medicine

## 2013-11-17 DIAGNOSIS — R109 Unspecified abdominal pain: Secondary | ICD-10-CM | POA: Insufficient documentation

## 2013-11-17 DIAGNOSIS — Z87891 Personal history of nicotine dependence: Secondary | ICD-10-CM | POA: Insufficient documentation

## 2013-11-17 DIAGNOSIS — Z792 Long term (current) use of antibiotics: Secondary | ICD-10-CM | POA: Insufficient documentation

## 2013-11-17 DIAGNOSIS — Z8546 Personal history of malignant neoplasm of prostate: Secondary | ICD-10-CM | POA: Diagnosis not present

## 2013-11-17 DIAGNOSIS — Y846 Urinary catheterization as the cause of abnormal reaction of the patient, or of later complication, without mention of misadventure at the time of the procedure: Secondary | ICD-10-CM | POA: Insufficient documentation

## 2013-11-17 DIAGNOSIS — Z8679 Personal history of other diseases of the circulatory system: Secondary | ICD-10-CM | POA: Diagnosis not present

## 2013-11-17 DIAGNOSIS — T83091A Other mechanical complication of indwelling urethral catheter, initial encounter: Secondary | ICD-10-CM | POA: Insufficient documentation

## 2013-11-17 DIAGNOSIS — N390 Urinary tract infection, site not specified: Secondary | ICD-10-CM | POA: Insufficient documentation

## 2013-11-17 DIAGNOSIS — T839XXA Unspecified complication of genitourinary prosthetic device, implant and graft, initial encounter: Secondary | ICD-10-CM

## 2013-11-17 DIAGNOSIS — Z7982 Long term (current) use of aspirin: Secondary | ICD-10-CM | POA: Diagnosis not present

## 2013-11-17 LAB — URINE MICROSCOPIC-ADD ON

## 2013-11-17 LAB — URINALYSIS, ROUTINE W REFLEX MICROSCOPIC
Bilirubin Urine: NEGATIVE
Glucose, UA: NEGATIVE mg/dL
Ketones, ur: NEGATIVE mg/dL
Nitrite: NEGATIVE
PROTEIN: 30 mg/dL — AB
Specific Gravity, Urine: 1.01 (ref 1.005–1.030)
UROBILINOGEN UA: 0.2 mg/dL (ref 0.0–1.0)
pH: 8.5 — ABNORMAL HIGH (ref 5.0–8.0)

## 2013-11-17 MED ORDER — CEFPODOXIME PROXETIL 200 MG PO TABS: 200.0000 mg | ORAL_TABLET | Freq: Once | ORAL | Status: DC

## 2013-11-17 MED ORDER — CEFPODOXIME PROXETIL 200 MG PO TABS
200.0000 mg | ORAL_TABLET | Freq: Once | ORAL | Status: AC
  Administered 2013-11-17: 200 mg via ORAL
  Filled 2013-11-17: qty 1

## 2013-11-17 MED ORDER — CEFPODOXIME PROXETIL 100 MG PO TABS: 100.0000 mg | ORAL_TABLET | Freq: Two times a day (BID) | ORAL | Status: AC

## 2013-11-17 NOTE — ED Notes (Signed)
Pt states that during the night, his catheter stopped draining.  "It's bypassing the catheter and coming out my penis".  Pt denies pain.

## 2013-11-17 NOTE — ED Notes (Signed)
MD at bedside. 

## 2013-11-17 NOTE — ED Notes (Signed)
Instruction reinforced to cath care at home.  New leg bag applied.

## 2013-11-17 NOTE — ED Provider Notes (Signed)
CSN: [id number]     Arrival date & time 11/17/13  0820 History   First MD Initiated Contact with Patient 11/17/13 0830     Chief Complaint  Patient presents with  . Foley clogged      (Consider location/radiation/quality/duration/timing/severity/associated sxs/prior Treatment) Patient is a 80 y.o. male presenting with male genitourinary complaint. The history is provided by the patient.  Male GU Problem Presenting symptoms: dysuria   Context: during urination   Relieved by:  Nothing Worsened by:  Nothing tried Ineffective treatments:  None tried Associated symptoms: abdominal pain (mild suprapubic ttp)   Associated symptoms: no diarrhea, no fever, no hematuria, no nausea and no vomiting   Abdominal pain:    Location:  Suprapubic   Quality:  Pressure   Severity:  Mild   Onset quality:  Gradual   Timing:  Constant   Progression:  Unchanged   Chronicity:  New Risk factors: urinary catheter     Past Medical History  Diagnosis Date  . Prostate cancer   . Aortic aneurysm    Past Surgical History  Procedure Laterality Date  . Abdominal surgery    . Hernia repair      double hernia  . Transurethral resection of prostate N/A 09/20/2013    Procedure: TRANSURETHRAL RESECTION OF THE PROSTATE ;  Surgeon: Jorja Loa, MD;  Location: [facility];  Service: Urology;  Laterality: N/A;   No family history on file. History  Substance Use Topics  . Smoking status: Former Smoker    Quit date: 10/07/1988  . Smokeless tobacco: Current User    Types: Chew  . Alcohol Use: No    Review of Systems  Constitutional: Negative for fever.  HENT: Negative for drooling and rhinorrhea.   Eyes: Negative for pain.  Respiratory: Negative for cough and shortness of breath.   Cardiovascular: Negative for chest pain and leg swelling.  Gastrointestinal: Positive for abdominal pain (mild suprapubic ttp). Negative for nausea, vomiting and diarrhea.  Genitourinary: Positive for dysuria. Negative for  hematuria.  Musculoskeletal: Negative for gait problem and neck pain.  Skin: Negative for color change.  Neurological: Negative for numbness and headaches.  Hematological: Negative for adenopathy.  Psychiatric/Behavioral: Negative for behavioral problems.  All other systems reviewed and are negative.     Allergies  Codeine and Tamsulosin  Home Medications   Prior to Admission medications   Medication Sig Start Date End Date Taking? Authorizing Provider  aspirin EC 81 MG tablet Take 81 mg by mouth daily.    Historical Provider, MD  doxycycline (DORYX) 100 MG DR capsule Take 100 mg by mouth 2 (two) times daily.    Historical Provider, MD  sulfamethoxazole-trimethoprim (BACTRIM DS) 800-160 MG per tablet Take 1 tablet by mouth 2 (two) times daily. 09/20/13   Jorja Loa, MD   BP 136/75  Pulse 88  Temp(Src) 98 F (36.7 C) (Oral)  Resp 18  SpO2 100% Physical Exam  Nursing note and vitals reviewed. Constitutional: He is oriented to person, place, and time. He appears well-developed and well-nourished.  HENT:  Head: Normocephalic and atraumatic.  Right Ear: External ear normal.  Left Ear: External ear normal.  Nose: Nose normal.  Mouth/Throat: Oropharynx is clear and moist. No oropharyngeal exudate.  Eyes: Conjunctivae and EOM are normal. Pupils are equal, round, and reactive to light.  Neck: Normal range of motion. Neck supple.  Cardiovascular: Normal rate, regular rhythm, normal heart sounds and intact distal pulses.  Exam reveals no gallop and no friction  rub.   No murmur heard. Pulmonary/Chest: Effort normal and breath sounds normal. No respiratory distress. He has no wheezes.  Abdominal: Soft. Bowel sounds are normal. He exhibits no distension. There is tenderness (mild suprapubic ttp). There is no rebound and no guarding.  Musculoskeletal: Normal range of motion. He exhibits no edema and no tenderness.  Neurological: He is alert and oriented to person, place, and  time.  Skin: Skin is warm and dry.  Psychiatric: He has a normal mood and affect. His behavior is normal.    ED Course  Procedures (including critical care time) Labs Review Labs Reviewed  URINALYSIS, ROUTINE W REFLEX MICROSCOPIC - Abnormal; Notable for the following:    APPearance CLOUDY (*)    pH 8.5 (*)    Hgb urine dipstick MODERATE (*)    Protein, ur 30 (*)    Leukocytes, UA LARGE (*)    All other components within normal limits  URINE MICROSCOPIC-ADD ON - Abnormal; Notable for the following:    Bacteria, UA MANY (*)    Crystals TRIPLE PHOSPHATE CRYSTALS (*)    All other components within normal limits  URINE CULTURE    Imaging Review No results found.   EKG Interpretation None      MDM   Final diagnoses:  UTI (lower urinary tract infection)  Foley catheter problem, initial encounter    8:52 AM 80 y.o. male w/ hx of prostate cancer s/p TURP in June '15, seen by me earlier this month for obstructed foley pw another issue with his Foley catheter. He states that last night he checked his Foley bag and was empty and he was leaking urine around the Foley catheter. He notes some stinging with this. He also notes some mild suprapubic pressure. He denies any fevers, vomiting, diarrhea. His abdomen is soft and benign. His vital signs are unremarkable here. He states that he has had similar symptoms with UTIs in the past. Will flush Foley and get a urine specimen.  Foley found to be obstructed. We removed the existing catheter and replaced it. No complications during the replacement of the Foley. Urine was drawn from the new catheter. This urine is suspicious for infection. Will treat with vantin.   11:33 AM: Pt feeling much better after changing foley.  I have discussed the diagnosis/risks/treatment options with the patient and family and believe the pt to be eligible for discharge home to follow-up with urology as scheduled. We also discussed returning to the ED immediately if  new or worsening sx occur. We discussed the sx which are most concerning (e.g., return of suprapubic pain, fever, back pain) that necessitate immediate return. Medications administered to the patient during their visit and any new prescriptions provided to the patient are listed below.  Medications given during this visit Medications  cefpodoxime (VANTIN) tablet 200 mg (not administered)    New Prescriptions   CEFPODOXIME (VANTIN) 100 MG TABLET    Take 1 tablet (100 mg total) by mouth 2 (two) times daily.      Pamella Pert, MD 11/17/13 1133

## 2013-11-17 NOTE — ED Notes (Signed)
Attempted to irrigate foley.  No success.  Notified MD who also attempted.  Order given to replace cath.

## 2013-11-19 LAB — URINE CULTURE: Colony Count: >=100000

## 2013-11-20 ENCOUNTER — Telehealth (HOSPITAL_BASED_OUTPATIENT_CLINIC_OR_DEPARTMENT_OTHER): Payer: Self-pay | Admitting: Emergency Medicine

## 2013-11-20 NOTE — Progress Notes (Signed)
ED Antimicrobial Stewardship Positive Culture Follow Up   TAMERON LAMA is an 80 y.o. male who presented to [facility] on 11/17/2013 with a chief complaint of  Chief Complaint  Patient presents with  . Foley clogged     Recent Results (from the past 720 hour(s))  URINE CULTURE     Status: None   Collection Time    11/17/13 10:12 AM      Result Value Ref Range Status   Specimen Description URINE, CATHETERIZED   Final   Special Requests NONE   Final   Culture  Setup Time     Final   Value: 11/17/2013 15:30     Performed at [facility]     Final   Value: >=100,000 COLONIES/ML     Performed at [facility]   Culture     Final   Value: PROVIDENCIA RETTGERI     Performed at [facility]   Report Status 11/19/2013 FINAL   Final   Organism ID, Bacteria PROVIDENCIA RETTGERI   Final    [x]  Treated with cefpodoxime, organism resistant to prescribed antimicrobial []  Patient discharged originally without antimicrobial agent and treatment is now indicated  New antibiotic prescription: Bactrim DS 1 tablet BID x 7 days  ED Provider: Alvina Chou, PA-C   Candie Mile 11/20/2013, 9:41 AM Infectious Diseases Pharmacist Phone# [phone]

## 2013-11-20 NOTE — Telephone Encounter (Signed)
Post ED Visit - Positive Culture Follow-up: Successful Patient Follow-Up  Culture assessed and recommendations reviewed by: []  Wes [place], Pharm.D., BCPS [x]  Heide Guile, Pharm.D., BCPS []  Alycia Rossetti, Pharm.D., BCPS []  [place], Pharm.D., BCPS, AAHIVP []  Legrand Como, Pharm.D., BCPS, AAHIVP []  Hassie Bruce, Pharm.D. []  Milus Glazier, Pharm.D.  Positive urine culture >100,000 colonies/ml Providencia Rettgeri  []  Patient discharged without antimicrobial prescription and treatment is now indicated [x]  Organism is resistant to prescribed ED discharge antimicrobial []  Patient with positive blood cultures  Changes discussed with ED provider: Barnet Glasgow PA New antibiotic prescription Bactrim DS one tab bid x 7 days Called to [facility] patient, date 11/20/13, time 1455   Hazle Nordmann 11/20/2013, 2:55 PM

## 2014-02-11 ENCOUNTER — Emergency Department (HOSPITAL_COMMUNITY)
Admission: EM | Admit: 2014-02-11 | Discharge: 2014-02-11 | Disposition: A | Discharge status: Home / Self Care | Payer: Medicare Other | Attending: Emergency Medicine | Admitting: Emergency Medicine

## 2014-02-11 ENCOUNTER — Encounter (HOSPITAL_COMMUNITY): Payer: Self-pay | Admitting: *Deleted

## 2014-02-11 DIAGNOSIS — T83511A Infection and inflammatory reaction due to indwelling urethral catheter, initial encounter: Secondary | ICD-10-CM

## 2014-02-11 DIAGNOSIS — Z8679 Personal history of other diseases of the circulatory system: Secondary | ICD-10-CM | POA: Diagnosis not present

## 2014-02-11 DIAGNOSIS — T8351XA Infection and inflammatory reaction due to indwelling urinary catheter, initial encounter: Secondary | ICD-10-CM | POA: Diagnosis not present

## 2014-02-11 DIAGNOSIS — Y846 Urinary catheterization as the cause of abnormal reaction of the patient, or of later complication, without mention of misadventure at the time of the procedure: Secondary | ICD-10-CM | POA: Insufficient documentation

## 2014-02-11 DIAGNOSIS — N390 Urinary tract infection, site not specified: Secondary | ICD-10-CM

## 2014-02-11 DIAGNOSIS — R339 Retention of urine, unspecified: Secondary | ICD-10-CM | POA: Diagnosis present

## 2014-02-11 DIAGNOSIS — Z7982 Long term (current) use of aspirin: Secondary | ICD-10-CM | POA: Diagnosis not present

## 2014-02-11 DIAGNOSIS — Z8546 Personal history of malignant neoplasm of prostate: Secondary | ICD-10-CM | POA: Diagnosis not present

## 2014-02-11 DIAGNOSIS — Z87891 Personal history of nicotine dependence: Secondary | ICD-10-CM | POA: Diagnosis not present

## 2014-02-11 LAB — URINALYSIS, ROUTINE W REFLEX MICROSCOPIC
BILIRUBIN URINE: NEGATIVE
Glucose, UA: NEGATIVE mg/dL
Ketones, ur: NEGATIVE mg/dL
Nitrite: NEGATIVE
PH: 6.5 (ref 5.0–8.0)
Protein, ur: 30 mg/dL — AB
SPECIFIC GRAVITY, URINE: 1.012 (ref 1.005–1.030)
Urobilinogen, UA: 1 mg/dL (ref 0.0–1.0)

## 2014-02-11 LAB — URINE MICROSCOPIC-ADD ON

## 2014-02-11 MED ORDER — CEPHALEXIN 500 MG PO CAPS: 500.0000 mg | ORAL_CAPSULE | Freq: Once | ORAL | Status: DC

## 2014-02-11 MED ORDER — CEPHALEXIN 500 MG PO CAPS: 500.0000 mg | ORAL_CAPSULE | Freq: Four times a day (QID) | ORAL | Status: DC

## 2014-02-11 NOTE — Discharge Instructions (Signed)

## 2014-02-11 NOTE — ED Notes (Signed)
When changing the cath, noticed a small blood clot.

## 2014-02-11 NOTE — ED Provider Notes (Signed)
CSN: [id number]     Arrival date & time 02/11/14  0220 History   First MD Initiated Contact with Patient 02/11/14 0230     Chief Complaint  Patient presents with  . Urinary Retention     (Consider location/radiation/quality/duration/timing/severity/associated sxs/prior Treatment) HPI Comments: Here with family/caregiver with complaint of obstructed urinary catheter since last evening. No fever. He has had an indwelling catheter for the past 3 months, changed regularly by Dr. Diona Fanti and due again for change in 5 days. No fever, nausea or vomiting. He has abdominal pain in lower abdomen, worse over time duration of urinary retention.   The history is provided by the patient. No language interpreter was used.    Past Medical History  Diagnosis Date  . Prostate cancer   . Aortic aneurysm    Past Surgical History  Procedure Laterality Date  . Abdominal surgery    . Hernia repair      double hernia  . Transurethral resection of prostate N/A 09/20/2013    Procedure: TRANSURETHRAL RESECTION OF THE PROSTATE ;  Surgeon: Jorja Loa, MD;  Location: [facility];  Service: Urology;  Laterality: N/A;   No family history on file. History  Substance Use Topics  . Smoking status: Former Smoker    Quit date: 10/07/1988  . Smokeless tobacco: Current User    Types: Chew  . Alcohol Use: No    Review of Systems  Constitutional: Negative for fever.  Respiratory: Negative for cough and shortness of breath.   Cardiovascular: Negative for chest pain.  Gastrointestinal: Positive for abdominal pain. Negative for nausea and vomiting.  Genitourinary: Positive for difficulty urinating.       See HPI.  Musculoskeletal: Negative for myalgias.  Neurological: Negative for weakness and light-headedness.  Psychiatric/Behavioral: Negative for confusion.      Allergies  Codeine and Tamsulosin  Home Medications   Prior to Admission medications   Medication Sig Start Date End Date Taking?  Authorizing Provider  aspirin EC 81 MG tablet Take 81 mg by mouth daily.   Yes Historical Provider, MD   BP 152/97 mmHg  Pulse 95  Temp(Src) 97.5 F (36.4 C) (Oral)  Resp 18  SpO2 97% Physical Exam  Constitutional: He is oriented to person, place, and time. He appears well-developed and well-nourished.  Neck: Normal range of motion.  Pulmonary/Chest: Effort normal.  Abdominal:  Suprapubic tenderness. No significant abdominal distention.  Musculoskeletal: Normal range of motion.  Neurological: He is alert and oriented to person, place, and time.  Skin: Skin is warm and dry.  Psychiatric: He has a normal mood and affect.    ED Course  Procedures (including critical care time) Labs Review Labs Reviewed  URINE CULTURE  URINALYSIS, ROUTINE W REFLEX MICROSCOPIC    Imaging Review No results found.   EKG Interpretation None      MDM   Final diagnoses:  None    1. Foley catheter obstruction  Foley changed with significant urine drainage from bladder. Patient feeling much better, no further pain. UA showing infection, however, patient and caregiver report it has been Dr. Alan Ripper preference not to treat with abx. He will follow up with him in the office later today to schedule a recheck appointment.     Dewaine Oats, PA-C 02/12/14 [place], MD 02/12/14 [phone]

## 2014-02-11 NOTE — ED Notes (Signed)
Pt states that he has catheter for urinary retention and that this afternoon the catheter quit draining; pt states now that the urine is leaking around the catheter and pt c/o fullness to bladder

## 2014-02-13 LAB — URINE CULTURE

## 2014-02-17 ENCOUNTER — Telehealth (HOSPITAL_COMMUNITY): Payer: Self-pay

## 2014-02-17 NOTE — Telephone Encounter (Signed)
Post ED Visit - Positive Culture Follow-up  Culture report reviewed by antimicrobial stewardship pharmacist: []  Wes [place], Pharm.D., BCPS []  Heide Guile, Pharm.D., BCPS []  Alycia Rossetti, Pharm.D., BCPS []  [place], Pharm.D., BCPS, AAHIVP []  Legrand Como, Pharm.D., BCPS, AAHIVP [x]  Elicia Lamp, Pharm.D.   Positive Urine culture, >/= 100,000 colonies -> E Coli Treated with Cephalexin, organism sensitive to the same and no further patient follow-up is required at this time.  Dortha Kern 02/17/2014, 5:12 AM

## 2014-03-24 ENCOUNTER — Emergency Department (HOSPITAL_COMMUNITY): Payer: Medicare Other

## 2014-03-24 ENCOUNTER — Inpatient Hospital Stay (HOSPITAL_COMMUNITY)
Admission: EM | Admit: 2014-03-24 | Discharge: 2014-03-28 | DRG: 871 | Disposition: A | Discharge status: Skilled Nursing Facility | Payer: Medicare Other | Attending: Internal Medicine | Admitting: Internal Medicine

## 2014-03-24 ENCOUNTER — Encounter (HOSPITAL_COMMUNITY): Payer: Self-pay | Admitting: Oncology

## 2014-03-24 DIAGNOSIS — F17220 Nicotine dependence, chewing tobacco, uncomplicated: Secondary | ICD-10-CM | POA: Diagnosis not present

## 2014-03-24 DIAGNOSIS — G9340 Encephalopathy, unspecified: Secondary | ICD-10-CM | POA: Diagnosis present

## 2014-03-24 DIAGNOSIS — I82411 Acute embolism and thrombosis of right femoral vein: Secondary | ICD-10-CM | POA: Diagnosis present

## 2014-03-24 DIAGNOSIS — Z8546 Personal history of malignant neoplasm of prostate: Secondary | ICD-10-CM

## 2014-03-24 DIAGNOSIS — D6959 Other secondary thrombocytopenia: Secondary | ICD-10-CM | POA: Diagnosis present

## 2014-03-24 DIAGNOSIS — R509 Fever, unspecified: Secondary | ICD-10-CM

## 2014-03-24 DIAGNOSIS — D696 Thrombocytopenia, unspecified: Secondary | ICD-10-CM | POA: Diagnosis present

## 2014-03-24 DIAGNOSIS — T83511A Infection and inflammatory reaction due to indwelling urethral catheter, initial encounter: Secondary | ICD-10-CM

## 2014-03-24 DIAGNOSIS — J449 Chronic obstructive pulmonary disease, unspecified: Secondary | ICD-10-CM | POA: Diagnosis present

## 2014-03-24 DIAGNOSIS — E538 Deficiency of other specified B group vitamins: Secondary | ICD-10-CM | POA: Diagnosis present

## 2014-03-24 DIAGNOSIS — Z978 Presence of other specified devices: Secondary | ICD-10-CM

## 2014-03-24 DIAGNOSIS — J42 Unspecified chronic bronchitis: Secondary | ICD-10-CM

## 2014-03-24 DIAGNOSIS — G9341 Metabolic encephalopathy: Secondary | ICD-10-CM | POA: Diagnosis present

## 2014-03-24 DIAGNOSIS — Z66 Do not resuscitate: Secondary | ICD-10-CM | POA: Diagnosis present

## 2014-03-24 DIAGNOSIS — A4151 Sepsis due to Escherichia coli [E. coli]: Principal | ICD-10-CM | POA: Diagnosis present

## 2014-03-24 DIAGNOSIS — Z960 Presence of urogenital implants: Secondary | ICD-10-CM

## 2014-03-24 DIAGNOSIS — Y846 Urinary catheterization as the cause of abnormal reaction of the patient, or of later complication, without mention of misadventure at the time of the procedure: Secondary | ICD-10-CM | POA: Diagnosis present

## 2014-03-24 DIAGNOSIS — R7881 Bacteremia: Secondary | ICD-10-CM | POA: Diagnosis present

## 2014-03-24 DIAGNOSIS — I82409 Acute embolism and thrombosis of unspecified deep veins of unspecified lower extremity: Secondary | ICD-10-CM | POA: Diagnosis present

## 2014-03-24 DIAGNOSIS — N390 Urinary tract infection, site not specified: Secondary | ICD-10-CM | POA: Diagnosis present

## 2014-03-24 DIAGNOSIS — T8351XA Infection and inflammatory reaction due to indwelling urinary catheter, initial encounter: Secondary | ICD-10-CM | POA: Diagnosis not present

## 2014-03-24 DIAGNOSIS — A419 Sepsis, unspecified organism: Secondary | ICD-10-CM | POA: Diagnosis present

## 2014-03-24 LAB — CBC
HCT: 36.6 % — ABNORMAL LOW (ref 39.0–52.0)
Hemoglobin: 11.8 g/dL — ABNORMAL LOW (ref 13.0–17.0)
MCH: 29.1 pg (ref 26.0–34.0)
MCHC: 32.2 g/dL (ref 30.0–36.0)
MCV: 90.1 fL (ref 78.0–100.0)
Platelets: ADEQUATE 10*3/uL (ref 150–400)
RBC: 4.06 MIL/uL — ABNORMAL LOW (ref 4.22–5.81)
RDW: 16.4 % — ABNORMAL HIGH (ref 11.5–15.5)
WBC: 5.9 10*3/uL (ref 4.0–10.5)

## 2014-03-24 LAB — LACTIC ACID, PLASMA: Lactic Acid, Venous: 3.5 mmol/L — ABNORMAL HIGH (ref 0.5–2.2)

## 2014-03-24 LAB — URINE MICROSCOPIC-ADD ON

## 2014-03-24 LAB — URINALYSIS, ROUTINE W REFLEX MICROSCOPIC
Bilirubin Urine: NEGATIVE
Glucose, UA: NEGATIVE mg/dL
Ketones, ur: NEGATIVE mg/dL
Nitrite: NEGATIVE
Protein, ur: 100 mg/dL — AB
Specific Gravity, Urine: 1.01 (ref 1.005–1.030)
Urobilinogen, UA: 1 mg/dL (ref 0.0–1.0)
pH: 8 (ref 5.0–8.0)

## 2014-03-24 LAB — BASIC METABOLIC PANEL
Anion gap: 6 (ref 5–15)
BUN: 32 mg/dL — ABNORMAL HIGH (ref 6–23)
CO2: 27 mmol/L (ref 19–32)
Calcium: 8.1 mg/dL — ABNORMAL LOW (ref 8.4–10.5)
Chloride: 109 mEq/L (ref 96–112)
Creatinine, Ser: 1.18 mg/dL (ref 0.50–1.35)
GFR calc Af Amer: 61 mL/min — ABNORMAL LOW (ref >90)
GFR calc non Af Amer: 52 mL/min — ABNORMAL LOW (ref >90)
Glucose, Bld: 126 mg/dL — ABNORMAL HIGH (ref 70–99)
Potassium: 3.3 mmol/L — ABNORMAL LOW (ref 3.5–5.1)
Sodium: 142 mmol/L (ref 135–145)

## 2014-03-24 LAB — CK: Total CK: 32 U/L (ref 7–232)

## 2014-03-24 MED ORDER — ASPIRIN EC 81 MG PO TBEC
81.0000 mg | DELAYED_RELEASE_TABLET | Freq: Every day | ORAL | Status: DC
  Administered 2014-03-25 – 2014-03-26 (×2): 81 mg via ORAL
  Filled 2014-03-24 (×2): qty 1

## 2014-03-24 MED ORDER — DEXTROSE 5 % IV SOLN
1.0000 g | Freq: Once | INTRAVENOUS | Status: AC
  Administered 2014-03-24: 1 g via INTRAVENOUS
  Filled 2014-03-24: qty 10

## 2014-03-24 MED ORDER — ACETAMINOPHEN 325 MG PO TABS
650.0000 mg | ORAL_TABLET | Freq: Once | ORAL | Status: AC
  Administered 2014-03-24: 650 mg via ORAL
  Filled 2014-03-24: qty 2

## 2014-03-24 MED ORDER — ACETAMINOPHEN 325 MG PO TABS: 650.0000 mg | ORAL_TABLET | Freq: Four times a day (QID) | ORAL | Status: DC | PRN

## 2014-03-24 MED ORDER — SODIUM CHLORIDE 0.9 % IJ SOLN
3.0000 mL | Freq: Two times a day (BID) | INTRAMUSCULAR | Status: DC
  Administered 2014-03-26 – 2014-03-27 (×3): 3 mL via INTRAVENOUS

## 2014-03-24 MED ORDER — ACETAMINOPHEN 650 MG RE SUPP: 650.0000 mg | Freq: Four times a day (QID) | RECTAL | Status: DC | PRN

## 2014-03-24 MED ORDER — SODIUM CHLORIDE 0.9 % IV BOLUS (SEPSIS)
1000.0000 mL | Freq: Once | INTRAVENOUS | Status: AC
  Administered 2014-03-24: 1000 mL via INTRAVENOUS

## 2014-03-24 MED ORDER — DEXTROSE 5 % IV SOLN
1.0000 g | INTRAVENOUS | Status: DC
  Administered 2014-03-25: 1 g via INTRAVENOUS
  Filled 2014-03-24: qty 10

## 2014-03-24 MED ORDER — ONDANSETRON HCL 4 MG PO TABS: 4.0000 mg | ORAL_TABLET | Freq: Four times a day (QID) | ORAL | Status: DC | PRN

## 2014-03-24 MED ORDER — ONDANSETRON HCL 4 MG/2ML IJ SOLN: 4.0000 mg | Freq: Four times a day (QID) | INTRAMUSCULAR | Status: DC | PRN

## 2014-03-24 MED ORDER — HEPARIN SODIUM (PORCINE) 5000 UNIT/ML IJ SOLN
5000.0000 [IU] | Freq: Three times a day (TID) | INTRAMUSCULAR | Status: DC
  Administered 2014-03-25 (×2): 5000 [IU] via SUBCUTANEOUS
  Filled 2014-03-24 (×5): qty 1

## 2014-03-24 MED ORDER — SODIUM CHLORIDE 0.9 % IV SOLN
INTRAVENOUS | Status: AC
  Administered 2014-03-24 – 2014-03-25 (×2): via INTRAVENOUS

## 2014-03-24 MED ORDER — ALBUTEROL SULFATE (2.5 MG/3ML) 0.083% IN NEBU: 2.5000 mg | INHALATION_SOLUTION | RESPIRATORY_TRACT | Status: DC | PRN

## 2014-03-24 MED ORDER — GUAIFENESIN-DM 100-10 MG/5ML PO SYRP: 5.0000 mL | ORAL_SOLUTION | ORAL | Status: DC | PRN

## 2014-03-24 NOTE — ED Notes (Signed)
Family at bedside. 

## 2014-03-24 NOTE — ED Notes (Signed)
MD at bedside. 

## 2014-03-24 NOTE — ED Provider Notes (Signed)
CSN: [id number]     Arrival date & time 03/24/14  2023 History   First MD Initiated Contact with Patient 03/24/14 2029     Chief Complaint  Patient presents with  . Fall     (Consider location/radiation/quality/duration/timing/severity/associated sxs/prior Treatment) HPI   80 year old male found laying next to his couch in his home earlier today. Family came to check on him after they called and did not get an answer. Patient states that he normally sleeps on his couch. He was too weak to get up earlier and slid to the floor. He denies any significant pain from this incident. He is unsure how long he may have been on the floor. Mild cough and shortness of breath. Cough is nonproductive. Denies any fevers. Hx of prostate CA and chronic foley. He is unsure is when last changed.   Past Medical History  Diagnosis Date  . Prostate cancer   . Aortic aneurysm    Past Surgical History  Procedure Laterality Date  . Abdominal surgery    . Hernia repair      double hernia  . Transurethral resection of prostate N/A 09/20/2013    Procedure: TRANSURETHRAL RESECTION OF THE PROSTATE ;  Surgeon: Jorja Loa, MD;  Location: [facility];  Service: Urology;  Laterality: N/A;   History reviewed. No pertinent family history. History  Substance Use Topics  . Smoking status: Former Smoker    Quit date: 10/07/1988  . Smokeless tobacco: Current User    Types: Chew  . Alcohol Use: No    Review of Systems  All systems reviewed and negative, other than as noted in HPI.   Allergies  Codeine and Tamsulosin  Home Medications   Prior to Admission medications   Medication Sig Start Date End Date Taking? Authorizing Provider  aspirin EC 81 MG tablet Take 81 mg by mouth daily.    Historical Provider, MD   BP 112/59 mmHg  Pulse 103  Temp(Src) 101.4 F (38.6 C) (Oral)  Resp 22  SpO2 89% Physical Exam  Constitutional: He appears well-developed and well-nourished. No distress.  HENT:  Head:  Normocephalic and atraumatic.  Eyes: Conjunctivae are normal. Right eye exhibits no discharge. Left eye exhibits no discharge.  Neck: Neck supple.  Cardiovascular: Regular rhythm and normal heart sounds.  Exam reveals no gallop and no friction rub.   No murmur heard. Mildly tachycardic  Pulmonary/Chest: Effort normal and breath sounds normal. No respiratory distress.  Abdominal: Soft. He exhibits no distension. There is no tenderness.  Genitourinary:  Foley with tea colored urine in bag.  Musculoskeletal: He exhibits no edema or tenderness.  Right lower extremity pitting edema. No calf tenderness. Palpable DP pulses bilaterally. No cellulitic changes.  Neurological: He is alert.  Speech clear. Content appropriate.   Skin: Skin is warm and dry. He is not diaphoretic.  Psychiatric: He has a normal mood and affect. His behavior is normal. Thought content normal.  Nursing note and vitals reviewed.   ED Course  Procedures (including critical care time) Labs Review Labs Reviewed  URINALYSIS, ROUTINE W REFLEX MICROSCOPIC - Abnormal; Notable for the following:    Color, Urine AMBER (*)    APPearance TURBID (*)    Hgb urine dipstick LARGE (*)    Protein, ur 100 (*)    Leukocytes, UA LARGE (*)    All other components within normal limits  CBC - Abnormal; Notable for the following:    RBC 4.06 (*)    Hemoglobin 11.8 (*)  HCT 36.6 (*)    RDW 16.4 (*)    All other components within normal limits  BASIC METABOLIC PANEL - Abnormal; Notable for the following:    Potassium 3.3 (*)    Glucose, Bld 126 (*)    BUN 32 (*)    Calcium 8.1 (*)    GFR calc non Af Amer 52 (*)    GFR calc Af Amer 61 (*)    All other components within normal limits  LACTIC ACID, PLASMA - Abnormal; Notable for the following:    Lactic Acid, Venous 3.5 (*)    All other components within normal limits  TROPONIN I - Abnormal; Notable for the following:    Troponin I 0.04 (*)    All other components within normal  limits  URINE MICROSCOPIC-ADD ON - Abnormal; Notable for the following:    Bacteria, UA MANY (*)    All other components within normal limits  BASIC METABOLIC PANEL - Abnormal; Notable for the following:    Potassium 2.9 (*)    Glucose, Bld 171 (*)    BUN 29 (*)    Calcium 7.4 (*)    GFR calc non Af Amer 56 (*)    GFR calc Af Amer 65 (*)    Anion gap 3 (*)    All other components within normal limits  CBC - Abnormal; Notable for the following:    RBC 3.34 (*)    Hemoglobin 9.7 (*)    HCT 30.0 (*)    RDW 16.6 (*)    All other components within normal limits  APTT - Abnormal; Notable for the following:    aPTT 38 (*)    All other components within normal limits  PROTIME-INR - Abnormal; Notable for the following:    Prothrombin Time 15.9 (*)    All other components within normal limits  VITAMIN B12 - Abnormal; Notable for the following:    Vitamin B-12 171 (*)    All other components within normal limits  IRON AND TIBC - Abnormal; Notable for the following:    Iron 12 (*)    TIBC 147 (*)    Saturation Ratios 8 (*)    All other components within normal limits  FERRITIN - Abnormal; Notable for the following:    Ferritin 508 (*)    All other components within normal limits  RETICULOCYTES - Abnormal; Notable for the following:    RBC. 3.75 (*)    All other components within normal limits  HEPARIN LEVEL (UNFRACTIONATED) - Abnormal; Notable for the following:    Heparin Unfractionated 0.11 (*)    All other components within normal limits  CBC - Abnormal; Notable for the following:    RBC 3.93 (*)    Hemoglobin 11.5 (*)    HCT 35.8 (*)    RDW 16.9 (*)    Platelets 102 (*)    All other components within normal limits  BASIC METABOLIC PANEL - Abnormal; Notable for the following:    Glucose, Bld 116 (*)    Calcium 7.9 (*)    GFR calc non Af Amer 74 (*)    GFR calc Af Amer 85 (*)    Anion gap 4 (*)    All other components within normal limits  HEPARIN LEVEL (UNFRACTIONATED)  - Abnormal; Notable for the following:    Heparin Unfractionated 0.17 (*)    All other components within normal limits  CBC - Abnormal; Notable for the following:    RBC 3.92 (*)  Hemoglobin 11.4 (*)    HCT 35.5 (*)    RDW 17.2 (*)    Platelets 104 (*)    All other components within normal limits  CULTURE, BLOOD (ROUTINE X 2)  CULTURE, BLOOD (ROUTINE X 2)  URINE CULTURE  CK  MAGNESIUM  FOLATE  LACTIC ACID, PLASMA  TROPONIN I  POTASSIUM    Imaging Review No results found.   Dg Chest Portable 1 View  03/24/2014   CLINICAL DATA:  Shortness of breath.  EXAM: PORTABLE CHEST - 1 VIEW  COMPARISON:  03/13/2008  FINDINGS: Chronic areas of fibrosis noted in the lungs. Underlying COPD. Heart is upper limits normal in size. Mediastinal contours are within normal limits. No effusions or acute infiltrates. No acute bony abnormality.  IMPRESSION: COPD/fibrosis.  No active disease.   Electronically Signed   By: Rolm Baptise M.D.   On: 03/24/2014 20:58    EKG Interpretation   Date/Time:  Sunday March 24 2014 20:42:35 EST Ventricular Rate:  103 PR Interval:  169 QRS Duration: 129 QT Interval:  400 QTC Calculation: 524 R Axis:   6 Text Interpretation:  Sinus tachycardia Atrial premature complex Right  bundle branch block ED PHYSICIAN INTERPRETATION AVAILABLE IN [facility] Confirmed by TEST, Record ([id number]) on 03/26/2014 8:45:07 AM      MDM   Final diagnoses:  Fever  Urinary tract infection associated with catheterization of urinary tract, initial encounter   80 year old male with generalized weakness. Febrile. Suspect his weakness is secondary to infectious process. With indwelling catheter, UTI is most likely source. Noted be mildly hypoxic and does report a recent cough. Hx of COPD but not on home 02. We'll obtain a chest x-ray. Abdominal exam is benign. No open wounds noted. He does have some right lower extremity swelling. He is unable to me how acute this may or may not  be. He denies any pain in his right lower extremity. No signs of cellulitis.   Virgel Manifold, MD 03/28/14 [phone]

## 2014-03-24 NOTE — H&P (Addendum)
PATIENT DETAILS Name: George Lara Age: 80 y.o. Sex: male Date of Birth: 27-Jan-1922 Admit Date: 03/24/2014 DQQ:[id number],QJJHER R, MD   CHIEF COMPLAINT:  Weakness, mild confusion  HPI: George Lara is a 80 y.o. male with a Past Medical History of prostate cancer with a chronic Foley catheter in place who presents today with the above noted complaint. Per patient, for the past few days he's been having chills, and back pain. Today he noted that he was too weak to get up and answer the phone every time it rang. At one point, he was just about able to stand up, and as a result his slid onto the floor. Since he was not answering his phone, family members went to check on him and found him on the floor. Patient initially appeared to be confused. He was then brought to the emergency room, where he was found to be febrile, further workup revealed a UTI. I was asked to admit this patient for further evaluation and treatment. There is no history of headache, chest pain, shortness of breath, nausea, vomiting or diarrhea.   ALLERGIES:   Allergies  Allergen Reactions  . Codeine Nausea And Vomiting  . Tamsulosin Other (See Comments)    "Passed out"    PAST MEDICAL HISTORY: Past Medical History  Diagnosis Date  . Prostate cancer   . Aortic aneurysm     PAST SURGICAL HISTORY: Past Surgical History  Procedure Laterality Date  . Abdominal surgery    . Hernia repair      double hernia  . Transurethral resection of prostate N/A 09/20/2013    Procedure: TRANSURETHRAL RESECTION OF THE PROSTATE ;  Surgeon: Jorja Loa, MD;  Location: [facility];  Service: Urology;  Laterality: N/A;    MEDICATIONS AT HOME: Prior to Admission medications   Medication Sig Start Date End Date Taking? Authorizing Provider  Acetohydroxamic Acid (LITHOSTAT) 250 MG TABS Take 1 tablet by mouth 3 (three) times daily.   Yes Historical Provider, MD  aspirin EC 81 MG tablet Take 81 mg by mouth  daily.   Yes Historical Provider, MD    FAMILY HISTORY: History reviewed. No pertinent family history.  SOCIAL HISTORY:  reports that he quit smoking about 25 years ago. His smokeless tobacco use includes Chew. He reports that he does not drink alcohol or use illicit drugs.  REVIEW OF SYSTEMS:  Constitutional:   No  weight loss, night sweats.  HEENT:    No headaches, Difficulty swallowing,Tooth/dental problems,Sore throat  Cardio-vascular: No chest pain,  Orthopnea, PND, swelling in lower extremities, anasarca  GI:  No heartburn, indigestion, abdominal pain, nausea, vomiting, diarrhea, change in   bowel habits, loss of appetite  Resp: No shortness of breath with exertion or at rest.  No excess mucus, no productive cough, No non-productive cough,  No coughing up of blood.No change in color of mucus.No wheezing.No chest wall deformity  Skin:  no rash or lesions.  GU:  no dysuria, change in color of urine, no urgency or frequency.  No flank pain.  Musculoskeletal: No joint pain or swelling.  No decreased range of motion.  No back pain.  Psych: No change in mood or affect. No depression or anxiety.  No memory loss.   PHYSICAL EXAM: Blood pressure 123/57, pulse 90, temperature 101.4 F (38.6 C), temperature source Oral, resp. rate 14, SpO2 91 %.  General appearance :Awake, alert, not in any distress. Speech Clear. Not toxic Looking HEENT:  Atraumatic and Normocephalic, pupils equally reactive to light and accomodation Neck: supple, no JVD. No cervical lymphadenopathy.  Chest:Good air entry bilaterally, no added sounds  CVS: S1 S2 regular, no murmurs.  Abdomen: Bowel sounds present, Non tender and not distended with no gaurding, rigidity or rebound. Extremities: B/L Lower Ext shows no edema, both legs are warm to touch Neurology:  Non focal Skin:No Rash Wounds:N/A  LABS ON ADMISSION:   Recent Labs  03/24/14 2041  NA 142  K 3.3*  CL 109  CO2 27  GLUCOSE 126*    BUN 32*  CREATININE 1.18  CALCIUM 8.1*   No results for input(s): AST, ALT, ALKPHOS, BILITOT, PROT, ALBUMIN in the last 72 hours. No results for input(s): LIPASE, AMYLASE in the last 72 hours.  Recent Labs  03/24/14 2041  WBC 5.9  HGB 11.8*  HCT 36.6*  MCV 90.1  PLT PLATELET CLUMPS NOTED ON SMEAR, COUNT APPEARS ADEQUATE    Recent Labs  03/24/14 2041 03/24/14 2042  CKTOTAL  --  32  TROPONINI A  --    No results for input(s): DDIMER in the last 72 hours. Invalid input(s): POCBNP   RADIOLOGIC STUDIES ON ADMISSION: Dg Chest Portable 1 View  03/24/2014   CLINICAL DATA:  Shortness of breath.  EXAM: PORTABLE CHEST - 1 VIEW  COMPARISON:  03/13/2008  FINDINGS: Chronic areas of fibrosis noted in the lungs. Underlying COPD. Heart is upper limits normal in size. Mediastinal contours are within normal limits. No effusions or acute infiltrates. No acute bony abnormality.  IMPRESSION: COPD/fibrosis.  No active disease.   Electronically Signed   By: Rolm Baptise M.D.   On: 03/24/2014 20:58    EKG: Independently reviewed. sinus tachycardia   ASSESSMENT AND PLAN: Present on Admission:  . UTI (urinary tract infection) due to urinary indwelling Foley catheter: Place on IV Rocephin, await urine/blood cultures. Follow clinical course. Place on appropriate oral antibiotics when culture results available.   . Encephalopathy acute: Secondary to above, current mental status seems to be significantly improved than on initial presentation. Suspect back to baseline following IV fluids/IV antibiotics.   Marland Kitchen SIRS (systemic inflammatory response syndrome): Secondary to UTI. Will manage with IV fluids and IV antibiotics.   . Right leg swelling: Check Doppler, has some mild erythema overlying-suspect some amount of chronic venous stasis.  Marland Kitchen COPD (chronic obstructive pulmonary disease): As needed bronchodilators.   Further plan will depend as patient's clinical course evolves and further radiologic and  laboratory data become available. Patient will be monitored closely.  Above noted plan was discussed with patient/son/daughter, they were in agreement.   DVT Prophylaxis: Prophylactic  Heparin  Code Status: DNR  Disposition Plan: home with home health services  Total time spent for admission equals 45 minutes.  [facility] Pager [phone]  If 7PM-7AM, please contact night-coverage [email or web address] Password [email or web address] 03/24/2014, 10:14 PM

## 2014-03-24 NOTE — ED Notes (Signed)
Per EMS pt lives alone, family was calling pt and not getting an answer so they went to check on him. Pt was found by family on the ground.  Pt is unclear what time fall took place. Reported to family that he was too weak to get up.  Pt denies pain, LOC.  Pt given 500 ml NS bolus.

## 2014-03-24 NOTE — ED Notes (Signed)
Bed: RESB Expected date:  Expected time:  Means of arrival:  Comments: EMS 80yo M fall off couch, weakness

## 2014-03-24 NOTE — ED Notes (Signed)
Hospitalist at bedside 

## 2014-03-25 DIAGNOSIS — Z9889 Other specified postprocedural states: Secondary | ICD-10-CM

## 2014-03-25 LAB — CBC
HEMATOCRIT: 30 % — AB (ref 39.0–52.0)
HEMOGLOBIN: 9.7 g/dL — AB (ref 13.0–17.0)
MCH: 29 pg (ref 26.0–34.0)
MCHC: 32.3 g/dL (ref 30.0–36.0)
MCV: 89.8 fL (ref 78.0–100.0)
Platelets: ADEQUATE 10*3/uL (ref 150–400)
RBC: 3.34 MIL/uL — AB (ref 4.22–5.81)
RDW: 16.6 % — ABNORMAL HIGH (ref 11.5–15.5)
WBC: 10.5 10*3/uL (ref 4.0–10.5)

## 2014-03-25 LAB — IRON AND TIBC
Iron: 12 ug/dL — ABNORMAL LOW (ref 42–165)
SATURATION RATIOS: 8 % — AB (ref 20–55)
TIBC: 147 ug/dL — AB (ref 215–435)
UIBC: 135 ug/dL (ref 125–400)

## 2014-03-25 LAB — TROPONIN I: Troponin I: 0.04 ng/mL — ABNORMAL HIGH (ref <0.031)

## 2014-03-25 LAB — APTT: aPTT: 38 seconds — ABNORMAL HIGH (ref 24–37)

## 2014-03-25 LAB — POTASSIUM: POTASSIUM: 3.7 mmol/L (ref 3.5–5.1)

## 2014-03-25 LAB — MAGNESIUM: Magnesium: 2 mg/dL (ref 1.5–2.5)

## 2014-03-25 LAB — BASIC METABOLIC PANEL
Anion gap: 3 — ABNORMAL LOW (ref 5–15)
BUN: 29 mg/dL — AB (ref 6–23)
CHLORIDE: 111 meq/L (ref 96–112)
CO2: 25 mmol/L (ref 19–32)
CREATININE: 1.11 mg/dL (ref 0.50–1.35)
Calcium: 7.4 mg/dL — ABNORMAL LOW (ref 8.4–10.5)
GFR calc non Af Amer: 56 mL/min — ABNORMAL LOW (ref >90)
GFR, EST AFRICAN AMERICAN: 65 mL/min — AB (ref >90)
GLUCOSE: 171 mg/dL — AB (ref 70–99)
POTASSIUM: 2.9 mmol/L — AB (ref 3.5–5.1)
Sodium: 139 mmol/L (ref 135–145)

## 2014-03-25 LAB — RETICULOCYTES
RBC.: 3.75 MIL/uL — ABNORMAL LOW (ref 4.22–5.81)
Retic Count, Absolute: 52.5 10*3/uL (ref 19.0–186.0)
Retic Ct Pct: 1.4 % (ref 0.4–3.1)

## 2014-03-25 LAB — PROTIME-INR
INR: 1.26 (ref 0.00–1.49)
Prothrombin Time: 15.9 seconds — ABNORMAL HIGH (ref 11.6–15.2)

## 2014-03-25 LAB — LACTIC ACID, PLASMA: LACTIC ACID, VENOUS: 2.1 mmol/L (ref 0.5–2.2)

## 2014-03-25 LAB — HEPARIN LEVEL (UNFRACTIONATED): Heparin Unfractionated: 0.11 IU/mL — ABNORMAL LOW (ref 0.30–0.70)

## 2014-03-25 MED ORDER — HEPARIN (PORCINE) IN NACL 100-0.45 UNIT/ML-% IJ SOLN
1150.0000 [IU]/h | INTRAMUSCULAR | Status: DC
Start: 2014-03-25 — End: 2014-03-26
  Administered 2014-03-26: 1150 [IU]/h via INTRAVENOUS
  Filled 2014-03-25: qty 250

## 2014-03-25 MED ORDER — POTASSIUM CHLORIDE CRYS ER 20 MEQ PO TBCR
40.0000 meq | EXTENDED_RELEASE_TABLET | Freq: Two times a day (BID) | ORAL | Status: AC
  Administered 2014-03-25 (×2): 40 meq via ORAL
  Filled 2014-03-25 (×2): qty 2

## 2014-03-25 MED ORDER — INFLUENZA VAC SPLIT QUAD 0.5 ML IM SUSY
0.5000 mL | PREFILLED_SYRINGE | INTRAMUSCULAR | Status: AC
  Administered 2014-03-26: 0.5 mL via INTRAMUSCULAR
  Filled 2014-03-25 (×2): qty 0.5

## 2014-03-25 MED ORDER — HEPARIN BOLUS VIA INFUSION
2000.0000 [IU] | Freq: Once | INTRAVENOUS | Status: AC
Start: 2014-03-25 — End: 2014-03-25
  Administered 2014-03-25: 2000 [IU] via INTRAVENOUS
  Filled 2014-03-25: qty 2000

## 2014-03-25 MED ORDER — PNEUMOCOCCAL VAC POLYVALENT 25 MCG/0.5ML IJ INJ
0.5000 mL | INJECTION | INTRAMUSCULAR | Status: AC
  Administered 2014-03-26: 0.5 mL via INTRAMUSCULAR
  Filled 2014-03-25 (×2): qty 0.5

## 2014-03-25 MED ORDER — POTASSIUM CHLORIDE 10 MEQ/100ML IV SOLN
10.0000 meq | INTRAVENOUS | Status: AC
  Administered 2014-03-25 (×3): 10 meq via INTRAVENOUS
  Filled 2014-03-25 (×3): qty 100

## 2014-03-25 MED ORDER — HEPARIN (PORCINE) IN NACL 100-0.45 UNIT/ML-% IJ SOLN
950.0000 [IU]/h | INTRAMUSCULAR | Status: DC
  Administered 2014-03-25: 950 [IU]/h via INTRAVENOUS
  Filled 2014-03-25: qty 250

## 2014-03-25 NOTE — Progress Notes (Signed)
Pharmacy note  In brief, 80 yoM on IV heparin for acute DVT.  Please see pharmacist note written earlier today for more details.    1st heparin level is 0.11 - subtherapeutic (goal 0.3-0.7).  No issues noted per RN. CBC this AM: Hgb low, platelets clumped but appears adequate.  Baseline INR WNL, PT / aPTT slightly elevated.   Plan:   Increase infusion rate to 1150 units/hr (=11.5 ml/hr).  Recheck HL in AM.   Ralene Bathe, PharmD, BCPS 03/25/2014, 9:42 PM  Pager: [phone]

## 2014-03-25 NOTE — Progress Notes (Signed)
PT Cancellation Note  Patient Details Name: George Lara MRN: [id number] DOB: 09-Jun-1921   Cancelled Treatment:    Reason Eval/Treat Not Completed: Medical issues which prohibited therapy.  Pt found to have R LE DVT and has not yet started Heparin.  Will check on pt tomorrow and see if appropriate for PT eval at that time.   [name] 03/25/2014, 11:37 AM

## 2014-03-25 NOTE — Progress Notes (Signed)
VASCULAR LAB PRELIMINARY  PRELIMINARY  PRELIMINARY  PRELIMINARY  Bilateral lower extremity venous duplex completed.    Preliminary report:  Positive for an extensive opcclusive deep vein thrombosis coursing through the peroneal, posterior tibial, popliteal, profunda, femoral, and common femoral veins. There is mild flow noted in the common femoral vein proximal to the saphenofemoral junction. There is no obvious evidence of a superficial thrombus or Baker's cyst.. Left - There is no evidence of a deep vein thrombosis, superficial thrombosis, or Baker's cyst.  Ethleen Lormand, RVS 03/25/2014, 9:21 AM

## 2014-03-25 NOTE — Progress Notes (Signed)
[facility] PROGRESS NOTE  George Lara BZJ:[id number] DOB: January 15, 1922 DOA: 03/24/2014 PCP: Simona Huh, MD  Assessment/Plan: Acute encephalopathy resolved.   SIRS: Probably from the urinary tract infection . Hydration and antibiotics.    Urinary tract infection , has indwelling chronic catheter. Cultures sent and on rocpehin.    Acute right DVT started on IV heparin .    Code Status: DNR Family Communication: SON AT BEDSIDE Disposition Plan: pending PT eval.    Consultants:  physcial therapy  Procedures:  Venous duplex  Antibiotics:  none  HPI/Subjective: Comfortable. No new complaints.   Objective: Filed Vitals:   03/25/14 0427  BP: 100/53  Pulse: 57  Temp: 97.8 F (36.6 C)  Resp: 18    Intake/Output Summary (Last 24 hours) at 03/25/14 1427 Last data filed at 03/25/14 1309  Gross per 24 hour  Intake   1200 ml  Output   1825 ml  Net   -625 ml   Filed Weights   03/24/14 2335  Weight: 60.873 kg (134 lb 3.2 oz)    Exam:   General:  Alert comfortable, pleasant  Cardiovascular: s1s2  Respiratory: ctab  Abdomen: soft non tender non distended bowel sounds heard  Musculoskeletal: right lower extremity swelling and redness.   Data Reviewed: Basic Metabolic Panel:  Recent Labs Lab 03/24/14 2041 03/25/14 0405 03/25/14 1030  NA 142 139  --   K 3.3* 2.9*  --   CL 109 111  --   CO2 27 25  --   GLUCOSE 126* 171*  --   BUN 32* 29*  --   CREATININE 1.18 1.11  --   CALCIUM 8.1* 7.4*  --   MG  --   --  2.0   Liver Function Tests: No results for input(s): AST, ALT, ALKPHOS, BILITOT, PROT, ALBUMIN in the last 168 hours. No results for input(s): LIPASE, AMYLASE in the last 168 hours. No results for input(s): AMMONIA in the last 168 hours. CBC:  Recent Labs Lab 03/24/14 2041 03/25/14 0405  WBC 5.9 10.5  HGB 11.8* 9.7*  HCT 36.6* 30.0*  MCV 90.1 89.8  PLT PLATELET CLUMPS NOTED ON SMEAR, COUNT APPEARS ADEQUATE PLATELET  CLUMPS NOTED ON SMEAR, COUNT APPEARS ADEQUATE   Cardiac Enzymes:  Recent Labs Lab 03/24/14 2041 03/24/14 2042  CKTOTAL  --  32  TROPONINI 0.04*  --    BNP (last 3 results) No results for input(s): PROBNP in the last 8760 hours. CBG: No results for input(s): GLUCAP in the last 168 hours.  No results found for this or any previous visit (from the past 240 hour(s)).   Studies: Dg Chest Portable 1 View  03/24/2014   CLINICAL DATA:  Shortness of breath.  EXAM: PORTABLE CHEST - 1 VIEW  COMPARISON:  03/13/2008  FINDINGS: Chronic areas of fibrosis noted in the lungs. Underlying COPD. Heart is upper limits normal in size. Mediastinal contours are within normal limits. No effusions or acute infiltrates. No acute bony abnormality.  IMPRESSION: COPD/fibrosis.  No active disease.   Electronically Signed   By: Rolm Baptise M.D.   On: 03/24/2014 20:58    Scheduled Meds: . aspirin EC  81 mg Oral Daily  . cefTRIAXone (ROCEPHIN)  IV  1 g Intravenous Q24H  . [START ON 03/26/2014] Influenza vac split quadrivalent PF  0.5 mL Intramuscular Tomorrow-1000  . [START ON 03/26/2014] pneumococcal 23 valent vaccine  0.5 mL Intramuscular Tomorrow-1000  . potassium chloride  10 mEq Intravenous Q1 Hr x 3  .  potassium chloride  40 mEq Oral BID  . sodium chloride  3 mL Intravenous Q12H   Continuous Infusions: . heparin 950 Units/hr (03/25/14 1130)    Principal Problem:   UTI (urinary tract infection) due to urinary indwelling Foley catheter Active Problems:   COPD (chronic obstructive pulmonary disease)   Chronic indwelling Foley catheter   SIRS (systemic inflammatory response syndrome)   Encephalopathy acute    Time spent: 25 minutes.     [facility] Pager [phone]. If 7PM-7AM, please contact night-coverage at [email or web address], password [facility] 03/25/2014, 2:27 PM  LOS: 1 day

## 2014-03-25 NOTE — Progress Notes (Addendum)
ANTICOAGULATION CONSULT NOTE - Initial Consult  Pharmacy Consult for Heparin Indication: DVT  Allergies  Allergen Reactions  . Codeine Nausea And Vomiting  . Tamsulosin Other (See Comments)    "Passed out"    Patient Measurements: Height: 5\' 8"  (172.7 cm) Weight: 134 lb 3.2 oz (60.873 kg) IBW/kg (Calculated) : 68.4 Heparin Dosing Weight: 61kg  Vital Signs: Temp: 97.8 F (36.6 C) (12/28 0427) Temp Source: Oral (12/28 0427) BP: 100/53 mmHg (12/28 0427) Pulse Rate: 57 (12/28 0427)  Labs:  Recent Labs  03/24/14 2041 03/24/14 2042 03/25/14 0405  HGB 11.8*  --  9.7*  HCT 36.6*  --  30.0*  PLT PLATELET CLUMPS NOTED ON SMEAR, COUNT APPEARS ADEQUATE  --  PLATELET CLUMPS NOTED ON SMEAR, COUNT APPEARS ADEQUATE  CREATININE 1.18  --  1.11  CKTOTAL  --  32  --   TROPONINI A  --   --     Estimated Creatinine Clearance: 38.1 mL/min (by C-G formula based on Cr of 1.11).   Medical History: Past Medical History  Diagnosis Date  . Prostate cancer   . Aortic aneurysm     Assessment: 89 YOM presents with weakness and mild confusion.  Lower extremity dopplers reveals extensive occlusive DVT in R leg.  Orders to start heparin gtt per pharmacy  Today, 03/25/2014  CBC: Hgb = 9.7, pltc = clumped but appears adequate per smear.  No baseline INR, aPTT  Goal of Therapy:  Heparin level 0.3-0.7 units/ml Monitor platelets by anticoagulation protocol: Yes   Plan:   Check baseline aPTT and INR  Heparin 2000 units x 1 then heparin 950 units/hr. Reduced dose bolus for heparin 5000 units SQ given at 6am.    Check 8hr heparin level   Daily CBC and heparin level  Await plans for long-term anticoagulation  Doreene Eland, PharmD, BCPS.   Pager: [phone]  03/25/2014,10:20 AM

## 2014-03-26 DIAGNOSIS — I82409 Acute embolism and thrombosis of unspecified deep veins of unspecified lower extremity: Secondary | ICD-10-CM | POA: Diagnosis present

## 2014-03-26 DIAGNOSIS — A4151 Sepsis due to Escherichia coli [E. coli]: Secondary | ICD-10-CM | POA: Diagnosis not present

## 2014-03-26 LAB — BASIC METABOLIC PANEL
ANION GAP: 4 — AB (ref 5–15)
BUN: 23 mg/dL (ref 6–23)
CO2: 27 mmol/L (ref 19–32)
Calcium: 7.9 mg/dL — ABNORMAL LOW (ref 8.4–10.5)
Chloride: 111 mEq/L (ref 96–112)
Creatinine, Ser: 0.87 mg/dL (ref 0.50–1.35)
GFR, EST AFRICAN AMERICAN: 85 mL/min — AB (ref >90)
GFR, EST NON AFRICAN AMERICAN: 74 mL/min — AB (ref >90)
Glucose, Bld: 116 mg/dL — ABNORMAL HIGH (ref 70–99)
POTASSIUM: 4.3 mmol/L (ref 3.5–5.1)
Sodium: 142 mmol/L (ref 135–145)

## 2014-03-26 LAB — URINE CULTURE: Colony Count: >=100000

## 2014-03-26 LAB — CBC
HCT: 35.8 % — ABNORMAL LOW (ref 39.0–52.0)
Hemoglobin: 11.5 g/dL — ABNORMAL LOW (ref 13.0–17.0)
MCH: 29.3 pg (ref 26.0–34.0)
MCHC: 32.1 g/dL (ref 30.0–36.0)
MCV: 91.1 fL (ref 78.0–100.0)
PLATELETS: 102 10*3/uL — AB (ref 150–400)
RBC: 3.93 MIL/uL — ABNORMAL LOW (ref 4.22–5.81)
RDW: 16.9 % — AB (ref 11.5–15.5)
WBC: 6.9 10*3/uL (ref 4.0–10.5)

## 2014-03-26 LAB — FOLATE: FOLATE: 4.4 ng/mL

## 2014-03-26 LAB — VITAMIN B12: VITAMIN B 12: 171 pg/mL — AB (ref 211–911)

## 2014-03-26 LAB — FERRITIN: Ferritin: 508 ng/mL — ABNORMAL HIGH (ref 22–322)

## 2014-03-26 LAB — HEPARIN LEVEL (UNFRACTIONATED): Heparin Unfractionated: 0.17 IU/mL — ABNORMAL LOW (ref 0.30–0.70)

## 2014-03-26 MED ORDER — RIVAROXABAN 15 MG PO TABS
15.0000 mg | ORAL_TABLET | Freq: Two times a day (BID) | ORAL | Status: DC
  Administered 2014-03-26 – 2014-03-28 (×4): 15 mg via ORAL
  Filled 2014-03-26 (×6): qty 1

## 2014-03-26 MED ORDER — RIVAROXABAN 20 MG PO TABS: 20.0000 mg | ORAL_TABLET | Freq: Every day | ORAL | Status: DC

## 2014-03-26 MED ORDER — CYANOCOBALAMIN 1000 MCG/ML IJ SOLN
1000.0000 ug | Freq: Once | INTRAMUSCULAR | Status: AC
  Administered 2014-03-26: 1000 ug via INTRAMUSCULAR
  Filled 2014-03-26: qty 1

## 2014-03-26 MED ORDER — HEPARIN (PORCINE) IN NACL 100-0.45 UNIT/ML-% IJ SOLN
1350.0000 [IU]/h | INTRAMUSCULAR | Status: AC
  Filled 2014-03-26: qty 250

## 2014-03-26 MED ORDER — CYANOCOBALAMIN 500 MCG PO TABS
500.0000 ug | ORAL_TABLET | Freq: Every day | ORAL | Status: DC
  Administered 2014-03-27 – 2014-03-28 (×2): 500 ug via ORAL
  Filled 2014-03-26 (×2): qty 1

## 2014-03-26 MED ORDER — CEFTRIAXONE SODIUM IN DEXTROSE 40 MG/ML IV SOLN
2.0000 g | INTRAVENOUS | Status: DC
  Administered 2014-03-26 – 2014-03-27 (×2): 2 g via INTRAVENOUS
  Filled 2014-03-26 (×2): qty 50

## 2014-03-26 NOTE — Progress Notes (Signed)
PT Cancellation Note  Patient Details Name: George Lara MRN: [id number] DOB: [birth year]   Cancelled Treatment:    Reason Eval/Treat Not Completed: Medical issues which prohibited therapy (pt subtherapeutic on heparin, plan per pharmacy to stop heparin gtt and start xarelto at 1700, will check back as schedule permits)   [name],[name] 03/26/2014, 3:16 PM [name], PT, DPT 03/26/2014 Pager: [phone]

## 2014-03-26 NOTE — Progress Notes (Signed)
[facility] PROGRESS NOTE  [name] SAY:[id number] DOB: Jul 04, 1921 DOA: 03/24/2014 PCP: Simona Huh, MD Interims summary: George Lara is a 80 y.o. male with a Past Medical History of prostate cancer with a chronic Foley catheter in place, comes in for confusion and was found to have abnormal UA. He was admitted for UTI on rocephin. He was also found to have a RLE DVT on anticoagulation. One of his  blood cultures came positive for gram negative rods and rocephin dose increased to 2 gms.   Assessment/Plan: Acute encephalopathy resolved.   SIRS: Probably from the urinary tract infection . Hydration and antibiotics.    Urinary tract infection , has indwelling chronic catheter. Cultures sent and on rocpehin. One of the blood cultures came positive for gram negative rods, identification pending and rocephin changed to 2g,.    Acute right DVT started on IV heparin , transitioned to po xarelto.    Code Status: DNR Family Communication: SON AT BEDSIDE Disposition Plan: pending PT eval.    Consultants:  physcial therapy  Procedures:  Venous duplex  Antibiotics:  Rocephin 12/27  HPI/Subjective: Comfortable. No new complaints.   Objective: Filed Vitals:   03/26/14 1408  BP: 98/51  Pulse: 71  Temp: 97.7 F (36.5 C)  Resp: 18    Intake/Output Summary (Last 24 hours) at 03/26/14 1646 Last data filed at 03/26/14 1500  Gross per 24 hour  Intake 945.35 ml  Output   4825 ml  Net -3879.65 ml   Filed Weights   03/24/14 2335  Weight: 60.873 kg (134 lb 3.2 oz)    Exam:   General:  Alert comfortable, pleasant  Cardiovascular: s1s2  Respiratory: ctab  Abdomen: soft non tender non distended bowel sounds heard  Musculoskeletal: right lower extremity swelling and redness.   Data Reviewed: Basic Metabolic Panel:  Recent Labs Lab 03/24/14 2041 03/25/14 0405 03/25/14 1030 03/25/14 2000 03/26/14 0630  NA 142 139  --   --  142  K 3.3*  2.9*  --  3.7 4.3  CL 109 111  --   --  111  CO2 27 25  --   --  27  GLUCOSE 126* 171*  --   --  116*  BUN 32* 29*  --   --  23  CREATININE 1.18 1.11  --   --  0.87  CALCIUM 8.1* 7.4*  --   --  7.9*  MG  --   --  2.0  --   --    Liver Function Tests: No results for input(s): AST, ALT, ALKPHOS, BILITOT, PROT, ALBUMIN in the last 168 hours. No results for input(s): LIPASE, AMYLASE in the last 168 hours. No results for input(s): AMMONIA in the last 168 hours. CBC:  Recent Labs Lab 03/24/14 2041 03/25/14 0405 03/26/14 0630  WBC 5.9 10.5 6.9  HGB 11.8* 9.7* 11.5*  HCT 36.6* 30.0* 35.8*  MCV 90.1 89.8 91.1  PLT PLATELET CLUMPS NOTED ON SMEAR, COUNT APPEARS ADEQUATE PLATELET CLUMPS NOTED ON SMEAR, COUNT APPEARS ADEQUATE 102*   Cardiac Enzymes:  Recent Labs Lab 03/24/14 2041 03/24/14 2042 03/25/14 1351  CKTOTAL  --  32  --   TROPONINI 0.04*  --  <0.03   BNP (last 3 results) No results for input(s): PROBNP in the last 8760 hours. CBG: No results for input(s): GLUCAP in the last 168 hours.  Recent Results (from the past 240 hour(s))  Blood culture (routine x 2)     Status: None (Preliminary  result)   Collection Time: 03/24/14  8:41 PM  Result Value Ref Range Status   Specimen Description BLOOD LEFT ARM  Final   Special Requests BOTTLES DRAWN AEROBIC AND ANAEROBIC 5CC  Final   Culture   Final    GRAM NEGATIVE RODS Note: Culture results may be compromised due to an inadequate volume of blood received in culture bottles. Gram Stain Report Called to,Read Back By and Verified With: CHLOE ADDISON 03/26/14 0543A [place] Performed at [facility]    Report Status PENDING  Incomplete  Blood culture (routine x 2)     Status: None (Preliminary result)   Collection Time: 03/24/14  8:42 PM  Result Value Ref Range Status   Specimen Description BLOOD RIGHT WRIST  Final   Special Requests BOTTLES DRAWN AEROBIC AND ANAEROBIC 5CC  Final   Culture   Final           BLOOD CULTURE  RECEIVED NO GROWTH TO DATE CULTURE WILL BE HELD FOR 5 DAYS BEFORE ISSUING A FINAL NEGATIVE REPORT Performed at [facility]    Report Status PENDING  Incomplete  Urine culture     Status: None   Collection Time: 03/24/14  9:53 PM  Result Value Ref Range Status   Specimen Description URINE, CLEAN CATCH  Final   Special Requests NONE  Final   Colony Count   Final    >=100,000 COLONIES/ML Performed at [facility]    Culture   Final    Multiple bacterial morphotypes present, none predominant. Suggest appropriate recollection if clinically indicated. Performed at [facility]    Report Status 03/26/2014 FINAL  Final     Studies: Dg Chest Portable 1 View  03/24/2014   CLINICAL DATA:  Shortness of breath.  EXAM: PORTABLE CHEST - 1 VIEW  COMPARISON:  03/13/2008  FINDINGS: Chronic areas of fibrosis noted in the lungs. Underlying COPD. Heart is upper limits normal in size. Mediastinal contours are within normal limits. No effusions or acute infiltrates. No acute bony abnormality.  IMPRESSION: COPD/fibrosis.  No active disease.   Electronically Signed   By: Rolm Baptise M.D.   On: 03/24/2014 20:58    Scheduled Meds: . cefTRIAXone (ROCEPHIN)  IV  2 g Intravenous Q24H  . [START ON 03/27/2014] vitamin B-12  500 mcg Oral Daily  . Rivaroxaban  15 mg Oral BID WC  . [START ON 04/17/2014] Rivaroxaban  20 mg Oral Q supper  . sodium chloride  3 mL Intravenous Q12H   Continuous Infusions: . heparin 1,350 Units/hr (03/26/14 0754)    Principal Problem:   UTI (urinary tract infection) due to urinary indwelling Foley catheter Active Problems:   COPD (chronic obstructive pulmonary disease)   Chronic indwelling Foley catheter   SIRS (systemic inflammatory response syndrome)   Encephalopathy acute    Time spent: 25 minutes.     [place] Pager [phone]. If 7PM-7AM, please contact night-coverage at [email or web address], password [facility] 03/26/2014, 4:46 PM   LOS: 2 days

## 2014-03-26 NOTE — Progress Notes (Signed)
[place] for Heparin --> xarelto Indication: DVT  Allergies  Allergen Reactions  . Codeine Nausea And Vomiting  . Tamsulosin Other (See Comments)    "Passed out"    Patient Measurements: Height: 5\' 8"  (172.7 cm) Weight: 134 lb 3.2 oz (60.873 kg) IBW/kg (Calculated) : 68.4 Heparin Dosing Weight: 61kg  Vital Signs: Temp: 97.7 F (36.5 C) (12/29 1408) Temp Source: Oral (12/29 1408) BP: 98/51 mmHg (12/29 1408) Pulse Rate: 71 (12/29 1408)  Labs:  Recent Labs  03/24/14 2041 03/24/14 2042 03/25/14 0405 03/25/14 1030 03/25/14 1351 03/25/14 2000 03/26/14 0630  HGB 11.8*  --  9.7*  --   --   --  11.5*  HCT 36.6*  --  30.0*  --   --   --  35.8*  PLT PLATELET CLUMPS NOTED ON SMEAR, COUNT APPEARS ADEQUATE  --  PLATELET CLUMPS NOTED ON SMEAR, COUNT APPEARS ADEQUATE  --   --   --  102*  APTT  --   --   --  38*  --   --   --   LABPROT  --   --   --  15.9*  --   --   --   INR  --   --   --  1.26  --   --   --   HEPARINUNFRC  --   --   --   --   --  0.11* 0.17*  CREATININE 1.18  --  1.11  --   --   --  0.87  CKTOTAL  --  32  --   --   --   --   --   TROPONINI 0.04*  --   --   --  <0.03  --   --     Estimated Creatinine Clearance: 48.6 mL/min (by C-G formula based on Cr of 0.87).   Medical History: Past Medical History  Diagnosis Date  . Prostate cancer   . Aortic aneurysm     Assessment: 89 YOM presents with weakness and mild confusion.  Lower extremity dopplers reveals extensive occlusive DVT in R leg.  Orders to start heparin gtt per pharmacy with orders to change to xarelto 12/30  Today, 03/26/2014   1st two heparin levels have been subtherapeutic, heparin level = 0.17 this am on 1150 units/hr.  No issues with infusion per RN  Orders to change from heparin gtt to xarelto today  CBC: Hgb = 11.5, pltc = 102 - appears consistent with previous valuse (CBCs during this admission had platelets clumped so no exact count)  Baseline  INR = 1.26, aPTT = 38sec  No bleeding issues noted  Goal of Therapy:  Dose xarelto for renal function and indication Monitor platelets by anticoagulation protocol: Yes   Plan:   At 17:00, stop heparin gtt and start xarelto 15mg  BID x 21days then xarelto 20mg  daily  Doreene Eland, PharmD, BCPS.   Pager: [phone]  03/26/2014,2:23 PM

## 2014-03-26 NOTE — Progress Notes (Addendum)
[place] for Heparin Indication: DVT  Allergies  Allergen Reactions  . Codeine Nausea And Vomiting  . Tamsulosin Other (See Comments)    "Passed out"    Patient Measurements: Height: 5\' 8"  (172.7 cm) Weight: 134 lb 3.2 oz (60.873 kg) IBW/kg (Calculated) : 68.4 Heparin Dosing Weight: 61kg  Vital Signs: Temp: 98.3 F (36.8 C) (12/29 0458) Temp Source: Oral (12/29 0458) BP: 111/55 mmHg (12/29 0458) Pulse Rate: 68 (12/29 0458)  Labs:  Recent Labs  03/24/14 2041 03/24/14 2042 03/25/14 0405 03/25/14 1030 03/25/14 1351 03/25/14 2000 03/26/14 0630  HGB 11.8*  --  9.7*  --   --   --  11.5*  HCT 36.6*  --  30.0*  --   --   --  35.8*  PLT PLATELET CLUMPS NOTED ON SMEAR, COUNT APPEARS ADEQUATE  --  PLATELET CLUMPS NOTED ON SMEAR, COUNT APPEARS ADEQUATE  --   --   --  102*  APTT  --   --   --  38*  --   --   --   LABPROT  --   --   --  15.9*  --   --   --   INR  --   --   --  1.26  --   --   --   HEPARINUNFRC  --   --   --   --   --  0.11* 0.17*  CREATININE 1.18  --  1.11  --   --   --  0.87  CKTOTAL  --  32  --   --   --   --   --   TROPONINI 0.04*  --   --   --  <0.03  --   --     Estimated Creatinine Clearance: 48.6 mL/min (by C-G formula based on Cr of 0.87).   Medical History: Past Medical History  Diagnosis Date  . Prostate cancer   . Aortic aneurysm     Assessment: 89 YOM presents with weakness and mild confusion.  Lower extremity dopplers reveals extensive occlusive DVT in R leg.  Orders to start heparin gtt per pharmacy  Today, 03/26/2014   1st two heparin levels have been subtherapeutic, heparin level = 0.17 this am on 1150 units/hr.  No issues with infusion per RN  CBC: Hgb = 11.5, pltc = 102 - appears consistent with previous valuse (CBCs during this admission had platelets clumped so no exact count)  Baseline INR = 1.26, aPTT = 38sec  No bleeding issues noted  Goal of Therapy:  Heparin level 0.3-0.7  units/ml Monitor platelets by anticoagulation protocol: Yes   Plan:   Increase heparin 1350 units/hr  Check 8hr heparin level   Daily CBC and heparin level  Await plans for long-term anticoagulation  Doreene Eland, PharmD, BCPS.   Pager: [phone]  03/26/2014,7:26 AM

## 2014-03-26 NOTE — Progress Notes (Signed)
CRITICAL VALUE ALERT  Critical value received:  Aerobic bottle gram negative rods  Date of notification:  03/25/14  Time of notification:  8337  Critical value read back:Yes.    Nurse who received alert:  Virgina Norfolk   MD notified (1st page):  Baltazar Najjar  Time of first page:  [phone]  MD notified (2nd page):  Time of second page:  Responding MD:  No response  Time MD responded:  N/A

## 2014-03-27 DIAGNOSIS — R7881 Bacteremia: Secondary | ICD-10-CM | POA: Diagnosis present

## 2014-03-27 DIAGNOSIS — A419 Sepsis, unspecified organism: Secondary | ICD-10-CM | POA: Diagnosis present

## 2014-03-27 LAB — CBC
HCT: 35.5 % — ABNORMAL LOW (ref 39.0–52.0)
Hemoglobin: 11.4 g/dL — ABNORMAL LOW (ref 13.0–17.0)
MCH: 29.1 pg (ref 26.0–34.0)
MCHC: 32.1 g/dL (ref 30.0–36.0)
MCV: 90.6 fL (ref 78.0–100.0)
PLATELETS: 104 10*3/uL — AB (ref 150–400)
RBC: 3.92 MIL/uL — ABNORMAL LOW (ref 4.22–5.81)
RDW: 17.2 % — AB (ref 11.5–15.5)
WBC: 6 10*3/uL (ref 4.0–10.5)

## 2014-03-27 NOTE — Evaluation (Signed)
Physical Therapy Evaluation Patient Details Name: George Lara MRN: [id number] DOB: Jun 15, 1921 Today's Date: 03/27/2014   History of Present Illness  80 y.o. male with a Past Medical History of prostate cancer with a chronic Foley catheter in place admitted for UTI was also found to have a R LE DVT, currently on xarelto.  Clinical Impression  Pt admitted with above diagnosis. Pt currently with functional limitations due to the deficits listed below (see PT Problem List).  Pt will benefit from skilled PT to increase their independence and safety with mobility to allow discharge to the venue listed below.   Pt from home alone and typically independent.  Pt at first denies falls at home however family reports unplanned events/uncontrolled descents (not always to floor) a few times at home recently.  Pt would benefit from ST-SNF prior to return home alone.     Follow Up Recommendations SNF    Equipment Recommendations  None recommended by PT    Recommendations for Other Services       Precautions / Restrictions Precautions Precautions: Fall      Mobility  Bed Mobility Overal bed mobility: Needs Assistance Bed Mobility: Supine to Sit     Supine to sit: Min guard     General bed mobility comments: required a hand to pull trunk upright  Transfers Overall transfer level: Needs assistance Equipment used: 1 person hand held assist Transfers: Sit to/from Stand Sit to Stand: Min assist         General transfer comment: assist to rise and steady  Ambulation/Gait Ambulation/Gait assistance: Min assist;Min guard Ambulation Distance (Feet): 200 Feet Assistive device: Rolling walker (2 wheeled) Gait Pattern/deviations: Step-through pattern;Decreased stride length;Trunk flexed Gait velocity: decr   General Gait Details: pt very unsteady with 1 HHA to room door so provided RW for more UE support and improvement observed with steadiness  Stairs            Wheelchair  Mobility    Modified Rankin (Stroke Patients Only)       Balance Overall balance assessment: Needs assistance;History of Falls         Standing balance support: Single extremity supported Standing balance-Leahy Scale: Fair                               Pertinent Vitals/Pain Pain Assessment: No/denies pain    Home Living Family/patient expects to be discharged to:: Skilled nursing facility Living Arrangements: Alone             Home Equipment: Gilford Rile - 2 wheels;Cane - single point      Prior Function Level of Independence: Independent               Hand Dominance        Extremity/Trunk Assessment               Lower Extremity Assessment: Generalized weakness         Communication   Communication: HOH  Cognition Arousal/Alertness: Awake/alert Behavior During Therapy: WFL for tasks assessed/performed Overall Cognitive Status: Within Functional Limits for tasks assessed                      General Comments General comments (skin integrity, edema, etc.): R lower leg edema however family reports much improved since admission    Exercises        Assessment/Plan    PT Assessment Patient needs continued PT services  PT Diagnosis Difficulty walking;Generalized weakness   PT Problem List Decreased strength;Decreased activity tolerance;Decreased balance;Decreased mobility;Decreased knowledge of use of DME  PT Treatment Interventions DME instruction;Gait training;Functional mobility training;Patient/family education;Therapeutic activities;Therapeutic exercise;Balance training;Neuromuscular re-education   PT Goals (Current goals can be found in the Care Plan section) Acute Rehab PT Goals PT Goal Formulation: With patient Time For Goal Achievement: 04/03/14 Potential to Achieve Goals: Good    Frequency Min 3X/week   Barriers to discharge        Co-evaluation               End of Session   Activity Tolerance:  Patient tolerated treatment well Patient left: in chair;with call bell/phone within reach;with family/visitor present (family reports they are remaining in room while pt in recliner)           Time: 9381-8299 PT Time Calculation (min) (ACUTE ONLY): 13 min   Charges:   PT Evaluation $Initial PT Evaluation Tier I: 1 Procedure PT Treatments $Gait Training: 8-22 mins   PT G Codes:        [name],[name] 03/27/2014, 12:16 PM [name], PT, DPT 03/27/2014 Pager: [phone]

## 2014-03-27 NOTE — Progress Notes (Signed)
Clinical Social Work Department CLINICAL SOCIAL WORK PLACEMENT NOTE 03/27/2014  Patient:  George Lara, George Lara  Account Number:  [id number] Admit date:  03/24/2014  Clinical Social Worker:  Renold Genta  Date/time:  03/27/2014 02:37 PM  Clinical Social Work is seeking post-discharge placement for this patient at the following level of care:   SKILLED NURSING   (*CSW will update this form in Epic as items are completed)   03/27/2014  Patient/family provided with [place] Department of Clinical Social Work's list of facilities offering this level of care within the geographic area requested by the patient (or if unable, by the patient's family).  03/27/2014  Patient/family informed of their freedom to choose among providers that offer the needed level of care, that participate in Medicare, Medicaid or managed care program needed by the patient, have an available bed and are willing to accept the patient.  03/27/2014  Patient/family informed of [facility]' ownership interest in [facility], as well as of the fact that they are under no obligation to receive care at this facility.  PASARR submitted to EDS on 03/27/2014 PASARR number received on 03/27/2014  FL2 transmitted to all facilities in geographic area requested by pt/family on  03/27/2014 FL2 transmitted to all facilities within larger geographic area on   Patient informed that his/her managed care company has contracts with or will negotiate with  certain facilities, including the following:   [facility]     Patient/family informed of bed offers received:   Patient chooses bed at  Physician recommends and patient chooses bed at    Patient to be transferred to  on   Patient to be transferred to facility by  Patient and family notified of transfer on  Name of family member notified:    The following physician request were entered in Epic:   Additional Comments:   Raynaldo Opitz, [place] Social Worker cell #: [phone]

## 2014-03-27 NOTE — Progress Notes (Signed)
Clinical Social Work Department BRIEF PSYCHOSOCIAL ASSESSMENT 03/27/2014  Patient:  George Lara, George Lara     Account Number:  [id number]     Admit date:  03/24/2014  Clinical Social Worker:  Renold Genta  Date/Time:  03/27/2014 02:20 PM  Referred by:  Physician  Date Referred:  03/27/2014 Referred for  SNF Placement   Other Referral:   Interview type:  Family Other interview type:   patient's daughter, [name] & son, Barnabas Lister at bedside    PSYCHOSOCIAL DATA Living Status:  ALONE Admitted from facility:   Level of care:   Primary support name:  Bernadene Person (daughter) h#: [phone] Primary support relationship to patient:  CHILD, ADULT Degree of support available:   good    CURRENT CONCERNS Current Concerns  Post-Acute Placement   Other Concerns:    SOCIAL WORK ASSESSMENT / PLAN CSW received referral for SNF placement.   Assessment/plan status:  Information/Referral to [employer] Other assessment/ plan:   Information/referral to community resources:   CSW completed FL2 and faxed information out to [place] SNFs - will provide bed offers when available.    PATIENT'S/FAMILY'S RESPONSE TO PLAN OF CARE: Patient's children requesting [facility] or [facility] - per [place] at [facility] they are unable to offer a bed & [facility] is out of network with his insurance [facility]). CSW awaiting bed offers.         Raynaldo Opitz, [facility] Clinical Social Worker cell #: [phone]

## 2014-03-27 NOTE — Progress Notes (Addendum)
[facility] PROGRESS NOTE  George Lara PZW:[id number] DOB: 1921-09-06 DOA: 03/24/2014 PCP: Simona Huh, MD  Brief narrative 80 year old male with history of COPD, prostate cancer with chronic Foley is intact with weakness and acute confusion. Patient was found to have SIRS with UTI. He was also found to have a right leg swelling which was followed by Doppler of his leg showing acute DVT. Patient admitted for further management.  Assessment/Plan: Sepsis with acute encephalopathy secondary to UTI and bacteremia SIRS and encephalopathy now resolved. Secondary to UTI. Continue empiric Rocephin. Cultures growing mixed bacteria. Both blood cx on admission growing ecoli. Pending sensitivity.  Escherichia coli bacteremia As outlined above,  secondary to UTI. On empiric Rocephin. Follow sensitivity.  Acute right DVT Patient started on IV heparin and transitioned to oral Xarelto. Will treat for a total 6 months.  Vitamin B12 deficiency Level of 171. Given IM B12 and started on oral cyanocobalamin supplements.  COPD Stable. Continue when necessary bronchodilators  DVT prophylaxis: On Xarelto  Diet: Regular  Code Status: DNR Family Communication: no one at bedside Disposition Plan: SNF pending final blood cx results   Consultants:  NONE  Procedures:  None  Antibiotics:  IV Rocephin since 12/27  HPI/Subjective: Patient seen and examined. Denies any pain, nausea or vomiting.  Objective: Filed Vitals:   03/27/14 1429  BP: 116/66  Pulse: 65  Temp: 98.1 F (36.7 C)  Resp: 19    Intake/Output Summary (Last 24 hours) at 03/27/14 1603 Last data filed at 03/27/14 1430  Gross per 24 hour  Intake    770 ml  Output   3700 ml  Net  -2930 ml   Filed Weights   03/24/14 2335  Weight: 60.873 kg (134 lb 3.2 oz)    Exam:   General:  Elderly thin built male in no acute distress  HEENT: No pallor, moist oral mucosa  Chest: Clear to auscultation  bilaterally  CVS: Normal S1 and S2, no murmurs  Abdomen: Soft, bowel sounds present, nondistended, nontender, chronic Foley in place  Extremities: Warm, no edema  CNS: Alert and oriented    Data Reviewed: Basic Metabolic Panel:  Recent Labs Lab 03/24/14 2041 03/25/14 0405 03/25/14 1030 03/25/14 2000 03/26/14 0630  NA 142 139  --   --  142  K 3.3* 2.9*  --  3.7 4.3  CL 109 111  --   --  111  CO2 27 25  --   --  27  GLUCOSE 126* 171*  --   --  116*  BUN 32* 29*  --   --  23  CREATININE 1.18 1.11  --   --  0.87  CALCIUM 8.1* 7.4*  --   --  7.9*  MG  --   --  2.0  --   --    Liver Function Tests: No results for input(s): AST, ALT, ALKPHOS, BILITOT, PROT, ALBUMIN in the last 168 hours. No results for input(s): LIPASE, AMYLASE in the last 168 hours. No results for input(s): AMMONIA in the last 168 hours. CBC:  Recent Labs Lab 03/24/14 2041 03/25/14 0405 03/26/14 0630 03/27/14 0435  WBC 5.9 10.5 6.9 6.0  HGB 11.8* 9.7* 11.5* 11.4*  HCT 36.6* 30.0* 35.8* 35.5*  MCV 90.1 89.8 91.1 90.6  PLT PLATELET CLUMPS NOTED ON SMEAR, COUNT APPEARS ADEQUATE PLATELET CLUMPS NOTED ON SMEAR, COUNT APPEARS ADEQUATE 102* 104*   Cardiac Enzymes:  Recent Labs Lab 03/24/14 2041 03/24/14 2042 03/25/14 1351  CKTOTAL  --  32  --   TROPONINI 0.04*  --  <0.03   BNP (last 3 results) No results for input(s): PROBNP in the last 8760 hours. CBG: No results for input(s): GLUCAP in the last 168 hours.  Recent Results (from the past 240 hour(s))  Blood culture (routine x 2)     Status: None (Preliminary result)   Collection Time: 03/24/14  8:41 PM  Result Value Ref Range Status   Specimen Description BLOOD LEFT ARM  Final   Special Requests BOTTLES DRAWN AEROBIC AND ANAEROBIC 5CC  Final   Culture   Final    ESCHERICHIA COLI Note: Culture results may be compromised due to an inadequate volume of blood received in culture bottles. Gram Stain Report Called to,Read Back By and Verified  With: CHLOE ADDISON 03/26/14 0543A [place] Performed at [facility]    Report Status PENDING  Incomplete  Blood culture (routine x 2)     Status: None (Preliminary result)   Collection Time: 03/24/14  8:42 PM  Result Value Ref Range Status   Specimen Description BLOOD RIGHT WRIST  Final   Special Requests BOTTLES DRAWN AEROBIC AND ANAEROBIC 5CC  Final   Culture   Final    ESCHERICHIA COLI Note: Gram Stain Report Called to,Read Back By and Verified With: Flonnie Hailstone RN 005R Performed at [facility]    Report Status PENDING  Incomplete  Urine culture     Status: None   Collection Time: 03/24/14  9:53 PM  Result Value Ref Range Status   Specimen Description URINE, CLEAN CATCH  Final   Special Requests NONE  Final   Colony Count   Final    >=100,000 COLONIES/ML Performed at [facility]    Culture   Final    Multiple bacterial morphotypes present, none predominant. Suggest appropriate recollection if clinically indicated. Performed at [facility]    Report Status 03/26/2014 FINAL  Final     Studies: No results found.  Scheduled Meds: . cefTRIAXone (ROCEPHIN)  IV  2 g Intravenous Q24H  . vitamin B-12  500 mcg Oral Daily  . Rivaroxaban  15 mg Oral BID WC  . [START ON 04/17/2014] Rivaroxaban  20 mg Oral Q supper  . sodium chloride  3 mL Intravenous Q12H   Continuous Infusions:     Time spent: [place], [place]  [facility] Pager [phone]. If 7PM-7AM, please contact night-coverage at [email or web address], password [facility] 03/27/2014, 4:03 PM  LOS: 3 days

## 2014-03-27 NOTE — Progress Notes (Signed)
CRITICAL VALUE ALERT  Critical value received:  Anaerobic bottle positive for gram negative rod  Date of notification:  12/29  Time of notification:  1900  Critical value read back:Yes.    Nurse who received alert:  Susie  MD notified (1st page):  Baltazar Najjar  Time of first page:  [phone]  MD notified (2nd page):  Time of second page:  Responding MD:  Baltazar Najjar  Time MD responded:  See comment below  Wallie Lagrand RN spoke to Rosanky, NP in person. [place] aware.  No new orders placed. Will continue to monitor closely.

## 2014-03-27 NOTE — Discharge Instructions (Signed)
Information on my medicine - XARELTO (rivaroxaban)  This medication education was reviewed with me or my healthcare representative as part of my discharge preparation.  The pharmacist that spoke with me during my hospital stay was:  Clovis Riley, [place]? Xarelto was prescribed to treat blood clots that may have been found in the veins of your legs (deep vein thrombosis) or in your lungs (pulmonary embolism) and to reduce the risk of them occurring again.  What do you need to know about Xarelto? The starting dose is one 15 mg tablet taken TWICE daily with food for the FIRST 21 DAYS then on (enter date)  April 17, 2014  the dose is changed to one 20 mg tablet taken ONCE A DAY with your evening meal.  DO NOT stop taking Xarelto without talking to the health care provider who prescribed the medication.  Refill your prescription for 20 mg tablets before you run out.  After discharge, you should have regular check-up appointments with your healthcare provider that is prescribing your Xarelto.  In the future your dose may need to be changed if your kidney function changes by a significant amount.  What do you do if you miss a dose? If you are taking Xarelto TWICE DAILY and you miss a dose, take it as soon as you remember. You may take two 15 mg tablets (total 30 mg) at the same time then resume your regularly scheduled 15 mg twice daily the next day.  If you are taking Xarelto ONCE DAILY and you miss a dose, take it as soon as you remember on the same day then continue your regularly scheduled once daily regimen the next day. Do not take two doses of Xarelto at the same time.   Important Safety Information Xarelto is a blood thinner medicine that can cause bleeding. You should call your healthcare provider right away if you experience any of the following: ? Bleeding from an injury or your nose that does not stop. ? Unusual colored urine (red or  dark brown) or unusual colored stools (red or black). ? Unusual bruising for unknown reasons. ? A serious fall or if you hit your head (even if there is no bleeding).  Some medicines may interact with Xarelto and might increase your risk of bleeding while on Xarelto. To help avoid this, consult your healthcare provider or pharmacist prior to using any new prescription or non-prescription medications, including herbals, vitamins, non-steroidal anti-inflammatory drugs (NSAIDs) and supplements.  This website has more information on Xarelto: [email or web address]

## 2014-03-28 DIAGNOSIS — I82401 Acute embolism and thrombosis of unspecified deep veins of right lower extremity: Secondary | ICD-10-CM

## 2014-03-28 DIAGNOSIS — E538 Deficiency of other specified B group vitamins: Secondary | ICD-10-CM | POA: Diagnosis present

## 2014-03-28 DIAGNOSIS — A4151 Sepsis due to Escherichia coli [E. coli]: Principal | ICD-10-CM

## 2014-03-28 DIAGNOSIS — D696 Thrombocytopenia, unspecified: Secondary | ICD-10-CM | POA: Diagnosis present

## 2014-03-28 LAB — CULTURE, BLOOD (ROUTINE X 2)

## 2014-03-28 MED ORDER — CIPROFLOXACIN HCL 500 MG PO TABS: 500.0000 mg | ORAL_TABLET | Freq: Two times a day (BID) | ORAL | Status: AC

## 2014-03-28 MED ORDER — CYANOCOBALAMIN 1000 MCG PO TABS: 1000.0000 ug | ORAL_TABLET | Freq: Every day | ORAL | Status: AC

## 2014-03-28 MED ORDER — RIVAROXABAN (XARELTO) VTE STARTER PACK (15 & 20 MG): ORAL_TABLET | ORAL | Status: DC

## 2014-03-28 MED ORDER — RIVAROXABAN 15 MG PO TABS: 15.0000 mg | ORAL_TABLET | Freq: Two times a day (BID) | ORAL | Status: DC

## 2014-03-28 MED ORDER — RIVAROXABAN 20 MG PO TABS: 20.0000 mg | ORAL_TABLET | Freq: Every day | ORAL | Status: DC

## 2014-03-28 NOTE — Plan of Care (Signed)
Problem: Phase II Progression Outcomes Goal: Voiding independently Outcome: Not Met (add Reason) Chronic Foley     

## 2014-03-28 NOTE — Progress Notes (Signed)
Patient is set to discharge to [facility] today. Patient & daughter, [name] at bedside aware. Discharge packet given to RN, Gregary Signs. [facility] called for transport to pickup at 1:00pm.   Clinical Social Work Department CLINICAL SOCIAL WORK PLACEMENT NOTE 03/28/2014  Patient:  George Lara, George Lara  Account Number:  [id number] Admit date:  03/24/2014  Clinical Social Worker:  Renold Genta  Date/time:  03/27/2014 02:37 PM  Clinical Social Work is seeking post-discharge placement for this patient at the following level of care:   [place]   (*CSW will update this form in Epic as items are completed)   03/27/2014  Patient/family provided with [place] Department of Clinical Social Work's list of facilities offering this level of care within the geographic area requested by the patient (or if unable, by the patient's family).  03/27/2014  Patient/family informed of their freedom to choose among providers that offer the needed level of care, that participate in Medicare, Medicaid or managed care program needed by the patient, have an available bed and are willing to accept the patient.  03/27/2014  Patient/family informed of [facility]' ownership interest in [facility], as well as of the fact that they are under no obligation to receive care at this facility.  PASARR submitted to EDS on 03/27/2014 PASARR number received on 03/27/2014  FL2 transmitted to all facilities in geographic area requested by pt/family on  03/27/2014 FL2 transmitted to all facilities within larger geographic area on   Patient informed that his/her managed care company has contracts with or will negotiate with  certain facilities, including the following:   [facility]     Patient/family informed of bed offers received:  03/28/2014 Patient chooses bed at [place] Physician recommends and patient chooses bed at    Patient to be transferred to [facility] on  03/28/2014 Patient to be transferred to facility by [facility] Patient and family notified of transfer on 03/28/2014 Name of family member notified:  patient's daughter, [name] at bedside  The following physician request were entered in Epic:   Additional Comments:     Raynaldo Opitz, [place] Social Worker cell #: [phone]

## 2014-03-28 NOTE — Discharge Summary (Signed)
Physician Discharge Summary  George Lara JSH:[id number] DOB: December 23, 1921 DOA: 03/24/2014  PCP: Simona Huh, MD  Admit date: 03/24/2014 Discharge date: 03/28/2014  Time spent: 35 minutes  Recommendations for Outpatient Follow-up:  1. D/c to [facility] 2. Patient will complete 2 weeks total course of abx on 04/07/2014 3. Patient being discharged on xarelto for Rt leg DVT. He needs total 6 months of anticoagulation ( until 09/25/2014) 4. pls check platelets in 1 week  Discharge Diagnoses:  Principal Problem:   Sepsis  Active Problems:   Bacteremia due to Escherichia coli   COPD (chronic obstructive pulmonary disease)   UTI (urinary tract infection) due to urinary indwelling Foley catheter   Chronic indwelling Foley catheter   Encephalopathy acute   DVT of lower limb, acute   Thrombocytopenia   B12 deficiency   Discharge Condition: fair  Diet recommendation: regular  Filed Weights   03/24/14 2335  Weight: 60.873 kg (134 lb 3.2 oz)    History of present illness:  Please refer to admission H&P for details, in brief, 80 year old male with history of COPD, prostate cancer with chronic Foley is intact with weakness and acute confusion. Patient was found to have SIRS with UTI. He was also found to have a right leg swelling which was followed by Doppler of his leg showing acute DVT. Patient admitted for further management.  Hospital Course:  Sepsis with acute encephalopathy secondary to UTI and ecoli bacteremia sepsis and encephalopathy now resolved. Secondary to ecoli UTI with bacteremia . Continue empiric Rocephin. urine Cultures growing mixed bacteria. Both blood cx on admission growing ecoli mostly sensitive ( except bactrim) .  pt afebrile and back to baseline. Will d/c on oral ciprofloxacin ( to complete 2 weeks of abx)   Escherichia coli bacteremia Seen on both blood cx on admission. Likely source is urine. Sensitive to PCN and quinolones. Will d/c on oral  ciprofloxacin to complete a 2 week course of abx.  Acute right DVT Patient started on IV heparin and transitioned to oral Xarelto. Will treat for a total 6 months of anticoagulation.( until 09/25/2014)  Vitamin B12 deficiency Level of 171. Given IM B12 and started on oral cyanocobalamin .  COPD Stable. Continue when necessary bronchodilators  Thrombocytopenia  likely due to sepsis. Follow up cbc in 1 week   DVT prophylaxis: On Xarelto  Diet: Regular  Code Status: DNR  Family Communication: son at bedside  Disposition Plan: SNF    Consultants:  NONE  Procedures:  None  Antibiotics:  IV Rocephin since 12/27  Oral ciprofloxacin 500 mg bid until 04/07/2014  Discharge Exam: Filed Vitals:   03/28/14 0506  BP: 105/53  Pulse: 56  Temp: 97.4 F (36.3 C)  Resp: 18     General: Elderly thin built male in no acute distress  HEENT: No pallor, moist oral mucosa  Chest: Clear to auscultation bilaterally  CVS: Normal S1 and S2, no murmurs  Abdomen: Soft, bowel sounds present, nondistended, nontender, chronic Foley in place  Extremities: Warm, no edema  CNS: Alert and oriented  Discharge Instructions    Current Discharge Medication List    START taking these medications   Details  ciprofloxacin (CIPRO) 500 MG tablet Take 1 tablet (500 mg total) by mouth 2 (two) times daily. Qty: 20 tablet, Refills: 0    cyanocobalamin 1000 MCG tablet Take 1 tablet (1,000 mcg total) by mouth daily. Qty: 30 tablet, Refills: 0    Rivaroxaban (XARELTO STARTER PACK) 15 & 20 MG  TBPK Take as directed on package: Start with one 15mg  tablet by mouth twice a day with food. On Day 22, switch to one 20mg  tablet once a day with food. Qty: 51 each, Refills: 0      CONTINUE these medications which have NOT CHANGED   Details  Acetohydroxamic Acid (LITHOSTAT) 250 MG TABS Take 1 tablet by mouth 3 (three) times daily.    aspirin EC 81 MG tablet Take 81 mg by mouth daily.        Allergies  Allergen Reactions  . Codeine Nausea And Vomiting  . Tamsulosin Other (See Comments)    "Passed out"   Follow-up Information    Please follow up.   Why:  MD at SNF in 1 week       The results of significant diagnostics from this hospitalization (including imaging, microbiology, ancillary and laboratory) are listed below for reference.    Significant Diagnostic Studies: Dg Chest Portable 1 View  03/24/2014   CLINICAL DATA:  Shortness of breath.  EXAM: PORTABLE CHEST - 1 VIEW  COMPARISON:  03/13/2008  FINDINGS: Chronic areas of fibrosis noted in the lungs. Underlying COPD. Heart is upper limits normal in size. Mediastinal contours are within normal limits. No effusions or acute infiltrates. No acute bony abnormality.  IMPRESSION: COPD/fibrosis.  No active disease.   Electronically Signed   By: Rolm Baptise M.D.   On: 03/24/2014 20:58    Microbiology: Recent Results (from the past 240 hour(s))  Blood culture (routine x 2)     Status: None   Collection Time: 03/24/14  8:41 PM  Result Value Ref Range Status   Specimen Description BLOOD LEFT ARM  Final   Special Requests BOTTLES DRAWN AEROBIC AND ANAEROBIC 5CC  Final   Culture   Final    ESCHERICHIA COLI Note: Culture results may be compromised due to an inadequate volume of blood received in culture bottles. Gram Stain Report Called to,Read Back By and Verified With: CHLOE ADDISON 03/26/14 0543A FULKC Performed at [facility]    Report Status 03/28/2014 FINAL  Final   Organism ID, Bacteria ESCHERICHIA COLI  Final      Susceptibility   Escherichia coli - MIC*    AMPICILLIN >=32 RESISTANT Resistant     AMPICILLIN/SULBACTAM 8 SENSITIVE Sensitive     CEFAZOLIN <=4 SENSITIVE Sensitive     CEFEPIME <=1 SENSITIVE Sensitive     CEFTAZIDIME <=1 SENSITIVE Sensitive     CEFTRIAXONE <=1 SENSITIVE Sensitive     CIPROFLOXACIN <=0.25 SENSITIVE Sensitive     GENTAMICIN <=1 SENSITIVE Sensitive     IMIPENEM <=0.25  SENSITIVE Sensitive     PIP/TAZO <=4 SENSITIVE Sensitive     TOBRAMYCIN <=1 SENSITIVE Sensitive     TRIMETH/SULFA >=320 RESISTANT Resistant     * ESCHERICHIA COLI  Blood culture (routine x 2)     Status: None   Collection Time: 03/24/14  8:42 PM  Result Value Ref Range Status   Specimen Description BLOOD RIGHT WRIST  Final   Special Requests BOTTLES DRAWN AEROBIC AND ANAEROBIC 5CC  Final   Culture   Final    ESCHERICHIA COLI Note: SUSCEPTIBILITIES PERFORMED ON PREVIOUS CULTURE WITHIN THE LAST 5 DAYS. Note: Gram Stain Report Called to,Read Back By and Verified With: Floyd [id number] Performed at [facility]    Report Status 03/28/2014 FINAL  Final  Urine culture     Status: None   Collection Time: 03/24/14  9:53 PM  Result Value  Ref Range Status   Specimen Description URINE, CLEAN CATCH  Final   Special Requests NONE  Final   Colony Count   Final    >=100,000 COLONIES/ML Performed at [facility]    Culture   Final    Multiple bacterial morphotypes present, none predominant. Suggest appropriate recollection if clinically indicated. Performed at [facility]    Report Status 03/26/2014 FINAL  Final     Labs: Basic Metabolic Panel:  Recent Labs Lab 03/24/14 2041 03/25/14 0405 03/25/14 1030 03/25/14 2000 03/26/14 0630  NA 142 139  --   --  142  K 3.3* 2.9*  --  3.7 4.3  CL 109 111  --   --  111  CO2 27 25  --   --  27  GLUCOSE 126* 171*  --   --  116*  BUN 32* 29*  --   --  23  CREATININE 1.18 1.11  --   --  0.87  CALCIUM 8.1* 7.4*  --   --  7.9*  MG  --   --  2.0  --   --    Liver Function Tests: No results for input(s): AST, ALT, ALKPHOS, BILITOT, PROT, ALBUMIN in the last 168 hours. No results for input(s): LIPASE, AMYLASE in the last 168 hours. No results for input(s): AMMONIA in the last 168 hours. CBC:  Recent Labs Lab 03/24/14 2041 03/25/14 0405 03/26/14 0630 03/27/14 0435  WBC 5.9 10.5 6.9 6.0  HGB 11.8* 9.7* 11.5*  11.4*  HCT 36.6* 30.0* 35.8* 35.5*  MCV 90.1 89.8 91.1 90.6  PLT PLATELET CLUMPS NOTED ON SMEAR, COUNT APPEARS ADEQUATE PLATELET CLUMPS NOTED ON SMEAR, COUNT APPEARS ADEQUATE 102* 104*   Cardiac Enzymes:  Recent Labs Lab 03/24/14 2041 03/24/14 2042 03/25/14 1351  CKTOTAL  --  32  --   TROPONINI 0.04*  --  <0.03   BNP: BNP (last 3 results) No results for input(s): PROBNP in the last 8760 hours. CBG: No results for input(s): GLUCAP in the last 168 hours.     SignedLouellen Molder  [facility] 03/28/2014, 11:13 AM

## 2014-04-24 ENCOUNTER — Inpatient Hospital Stay (HOSPITAL_COMMUNITY)
Admission: EM | Admit: 2014-04-24 | Discharge: 2014-04-29 | DRG: 698 | Disposition: A | Discharge status: Skilled Nursing Facility | Payer: Medicare Other | Attending: Internal Medicine | Admitting: Internal Medicine

## 2014-04-24 ENCOUNTER — Emergency Department (HOSPITAL_COMMUNITY): Payer: Medicare Other

## 2014-04-24 ENCOUNTER — Encounter (HOSPITAL_COMMUNITY): Payer: Self-pay | Admitting: Emergency Medicine

## 2014-04-24 DIAGNOSIS — Z8042 Family history of malignant neoplasm of prostate: Secondary | ICD-10-CM

## 2014-04-24 DIAGNOSIS — N179 Acute kidney failure, unspecified: Secondary | ICD-10-CM

## 2014-04-24 DIAGNOSIS — Z8249 Family history of ischemic heart disease and other diseases of the circulatory system: Secondary | ICD-10-CM

## 2014-04-24 DIAGNOSIS — Z833 Family history of diabetes mellitus: Secondary | ICD-10-CM

## 2014-04-24 DIAGNOSIS — Z66 Do not resuscitate: Secondary | ICD-10-CM | POA: Diagnosis present

## 2014-04-24 DIAGNOSIS — J8410 Pulmonary fibrosis, unspecified: Secondary | ICD-10-CM | POA: Diagnosis present

## 2014-04-24 DIAGNOSIS — R0902 Hypoxemia: Secondary | ICD-10-CM | POA: Diagnosis present

## 2014-04-24 DIAGNOSIS — Z515 Encounter for palliative care: Secondary | ICD-10-CM | POA: Diagnosis not present

## 2014-04-24 DIAGNOSIS — Z960 Presence of urogenital implants: Secondary | ICD-10-CM

## 2014-04-24 DIAGNOSIS — Z7982 Long term (current) use of aspirin: Secondary | ICD-10-CM | POA: Diagnosis not present

## 2014-04-24 DIAGNOSIS — Y846 Urinary catheterization as the cause of abnormal reaction of the patient, or of later complication, without mention of misadventure at the time of the procedure: Secondary | ICD-10-CM | POA: Diagnosis present

## 2014-04-24 DIAGNOSIS — N390 Urinary tract infection, site not specified: Secondary | ICD-10-CM | POA: Diagnosis present

## 2014-04-24 DIAGNOSIS — Z7901 Long term (current) use of anticoagulants: Secondary | ICD-10-CM | POA: Diagnosis not present

## 2014-04-24 DIAGNOSIS — W19XXXA Unspecified fall, initial encounter: Secondary | ICD-10-CM

## 2014-04-24 DIAGNOSIS — A419 Sepsis, unspecified organism: Secondary | ICD-10-CM | POA: Diagnosis present

## 2014-04-24 DIAGNOSIS — F17220 Nicotine dependence, chewing tobacco, uncomplicated: Secondary | ICD-10-CM | POA: Diagnosis present

## 2014-04-24 DIAGNOSIS — J449 Chronic obstructive pulmonary disease, unspecified: Secondary | ICD-10-CM | POA: Diagnosis present

## 2014-04-24 DIAGNOSIS — E860 Dehydration: Secondary | ICD-10-CM | POA: Diagnosis present

## 2014-04-24 DIAGNOSIS — Z6820 Body mass index (BMI) 20.0-20.9, adult: Secondary | ICD-10-CM | POA: Diagnosis not present

## 2014-04-24 DIAGNOSIS — T83511A Infection and inflammatory reaction due to indwelling urethral catheter, initial encounter: Secondary | ICD-10-CM

## 2014-04-24 DIAGNOSIS — I82409 Acute embolism and thrombosis of unspecified deep veins of unspecified lower extremity: Secondary | ICD-10-CM | POA: Diagnosis present

## 2014-04-24 DIAGNOSIS — E43 Unspecified severe protein-calorie malnutrition: Secondary | ICD-10-CM | POA: Diagnosis present

## 2014-04-24 DIAGNOSIS — J42 Unspecified chronic bronchitis: Secondary | ICD-10-CM

## 2014-04-24 DIAGNOSIS — Z22322 Carrier or suspected carrier of Methicillin resistant Staphylococcus aureus: Secondary | ICD-10-CM

## 2014-04-24 DIAGNOSIS — Z86718 Personal history of other venous thrombosis and embolism: Secondary | ICD-10-CM | POA: Diagnosis not present

## 2014-04-24 DIAGNOSIS — R627 Adult failure to thrive: Secondary | ICD-10-CM | POA: Diagnosis present

## 2014-04-24 DIAGNOSIS — C61 Malignant neoplasm of prostate: Secondary | ICD-10-CM | POA: Diagnosis present

## 2014-04-24 DIAGNOSIS — R319 Hematuria, unspecified: Secondary | ICD-10-CM | POA: Diagnosis present

## 2014-04-24 DIAGNOSIS — Z801 Family history of malignant neoplasm of trachea, bronchus and lung: Secondary | ICD-10-CM

## 2014-04-24 DIAGNOSIS — T8351XA Infection and inflammatory reaction due to indwelling urinary catheter, initial encounter: Principal | ICD-10-CM | POA: Diagnosis present

## 2014-04-24 DIAGNOSIS — Z978 Presence of other specified devices: Secondary | ICD-10-CM

## 2014-04-24 DIAGNOSIS — I82401 Acute embolism and thrombosis of unspecified deep veins of right lower extremity: Secondary | ICD-10-CM

## 2014-04-24 LAB — COMPREHENSIVE METABOLIC PANEL
ALT: 15 U/L (ref 0–53)
ANION GAP: 15 (ref 5–15)
AST: 27 U/L (ref 0–37)
Albumin: 3 g/dL — ABNORMAL LOW (ref 3.5–5.2)
Alkaline Phosphatase: 93 U/L (ref 39–117)
BUN: 28 mg/dL — ABNORMAL HIGH (ref 6–23)
CO2: 19 mmol/L (ref 19–32)
Calcium: 8.3 mg/dL — ABNORMAL LOW (ref 8.4–10.5)
Chloride: 102 mmol/L (ref 96–112)
Creatinine, Ser: 1.24 mg/dL (ref 0.50–1.35)
GFR calc Af Amer: 57 mL/min — ABNORMAL LOW (ref >90)
GFR calc non Af Amer: 49 mL/min — ABNORMAL LOW (ref >90)
Glucose, Bld: 166 mg/dL — ABNORMAL HIGH (ref 70–99)
Potassium: 3.8 mmol/L (ref 3.5–5.1)
SODIUM: 136 mmol/L (ref 135–145)
Total Bilirubin: 1.1 mg/dL (ref 0.3–1.2)
Total Protein: 6.7 g/dL (ref 6.0–8.3)

## 2014-04-24 LAB — CBC WITH DIFFERENTIAL/PLATELET
Basophils Absolute: 0 10*3/uL (ref 0.0–0.1)
Basophils Relative: 0 % (ref 0–1)
EOS ABS: 0 10*3/uL (ref 0.0–0.7)
EOS PCT: 0 % (ref 0–5)
HCT: 30.7 % — ABNORMAL LOW (ref 39.0–52.0)
HEMOGLOBIN: 9.9 g/dL — AB (ref 13.0–17.0)
LYMPHS PCT: 3 % — AB (ref 12–46)
Lymphs Abs: 0.3 10*3/uL — ABNORMAL LOW (ref 0.7–4.0)
MCH: 30.2 pg (ref 26.0–34.0)
MCHC: 32.2 g/dL (ref 30.0–36.0)
MCV: 93.6 fL (ref 78.0–100.0)
MONO ABS: 0.7 10*3/uL (ref 0.1–1.0)
MONOS PCT: 5 % (ref 3–12)
NEUTROS PCT: 92 % — AB (ref 43–77)
Neutro Abs: 11.6 10*3/uL — ABNORMAL HIGH (ref 1.7–7.7)
PLATELETS: 208 10*3/uL (ref 150–400)
RBC: 3.28 MIL/uL — AB (ref 4.22–5.81)
RDW: 17.9 % — ABNORMAL HIGH (ref 11.5–15.5)
WBC: 12.7 10*3/uL — ABNORMAL HIGH (ref 4.0–10.5)

## 2014-04-24 LAB — URINALYSIS, ROUTINE W REFLEX MICROSCOPIC
Glucose, UA: NEGATIVE mg/dL
Ketones, ur: 40 mg/dL — AB
Nitrite: POSITIVE — AB
Protein, ur: >300 mg/dL — AB
Specific Gravity, Urine: 1.013 (ref 1.005–1.030)
Urobilinogen, UA: 1 mg/dL (ref 0.0–1.0)
pH: 5.5 (ref 5.0–8.0)

## 2014-04-24 LAB — I-STAT CHEM 8, ED
BUN: 25 mg/dL — ABNORMAL HIGH (ref 6–23)
CALCIUM ION: 1.11 mmol/L — AB (ref 1.13–1.30)
Chloride: 104 mmol/L (ref 96–112)
Creatinine, Ser: 2 mg/dL — ABNORMAL HIGH (ref 0.50–1.35)
GLUCOSE: 170 mg/dL — AB (ref 70–99)
HCT: 32 % — ABNORMAL LOW (ref 39.0–52.0)
HEMOGLOBIN: 10.9 g/dL — AB (ref 13.0–17.0)
Potassium: 3.8 mmol/L (ref 3.5–5.1)
Sodium: 139 mmol/L (ref 135–145)
TCO2: 18 mmol/L (ref 0–100)

## 2014-04-24 LAB — I-STAT CG4 LACTIC ACID, ED
LACTIC ACID, VENOUS: 2.85 mmol/L — AB (ref 0.5–2.0)
Lactic Acid, Venous: 6.11 mmol/L (ref 0.5–2.0)

## 2014-04-24 MED ORDER — DEXTROSE 5 % IV SOLN
1.0000 g | Freq: Three times a day (TID) | INTRAVENOUS | Status: DC
  Filled 2014-04-24: qty 1

## 2014-04-24 MED ORDER — DEXTROSE 5 % IV SOLN
2.0000 g | INTRAVENOUS | Status: AC
  Administered 2014-04-25: 2 g via INTRAVENOUS
  Filled 2014-04-24: qty 2

## 2014-04-24 MED ORDER — SODIUM CHLORIDE 0.9 % IV BOLUS (SEPSIS)
1000.0000 mL | Freq: Once | INTRAVENOUS | Status: AC
  Administered 2014-04-24: 1000 mL via INTRAVENOUS

## 2014-04-24 MED ORDER — DEXTROSE 5 % IV SOLN
1.0000 g | INTRAVENOUS | Status: DC
  Administered 2014-04-25 – 2014-04-27 (×3): 1 g via INTRAVENOUS
  Filled 2014-04-24 (×4): qty 1

## 2014-04-24 MED ORDER — VANCOMYCIN HCL 10 G IV SOLR
1250.0000 mg | INTRAVENOUS | Status: AC
  Administered 2014-04-24: 1250 mg via INTRAVENOUS
  Filled 2014-04-24: qty 1250

## 2014-04-24 MED ORDER — VANCOMYCIN HCL IN DEXTROSE 750-5 MG/150ML-% IV SOLN
750.0000 mg | INTRAVENOUS | Status: DC
  Administered 2014-04-25 – 2014-04-28 (×4): 750 mg via INTRAVENOUS
  Filled 2014-04-24 (×4): qty 150

## 2014-04-24 MED ORDER — ACETAMINOPHEN 325 MG PO TABS
650.0000 mg | ORAL_TABLET | Freq: Once | ORAL | Status: AC
  Administered 2014-04-24: 650 mg via ORAL
  Filled 2014-04-24: qty 2

## 2014-04-24 NOTE — ED Notes (Signed)
MD at bedside. 

## 2014-04-24 NOTE — Progress Notes (Signed)
CSW attempted to meet with patient at bedside. However, nurse techs were at bedside obtaining labs.  Willette Brace [phone] ED CSW 04/24/2014 9:53 PM

## 2014-04-24 NOTE — ED Notes (Signed)
Notified EDP, Goldston,MD., pt. I-stat Lactic Acid results 2.85.

## 2014-04-24 NOTE — ED Notes (Signed)
notifed edp Goldston of criticle value Istat Lactic acid

## 2014-04-24 NOTE — Progress Notes (Signed)
  CARE MANAGEMENT ED NOTE 04/24/2014  Patient:  George Lara, George Lara   Account Number:  [id number]  Date Initiated:  04/24/2014  Documentation initiated by:  Livia Snellen  Subjective/Objective Assessment:   Patient presents to Ed after being found on the floor today by his son.  Foley catheter draing dark red, bloody     Subjective/Objective Assessment Detail:   Patient  with history of right leg DVT on Xarelto, COPD, prostate cancer with chronic foley, aortic aneurysm     Action/Plan:   Action/Plan Detail:   Anticipated DC Date:       Status Recommendation to Physician:   Result of Recommendation:    Other ED Services  Consult [facility] [place]  Other    Choice offered to / List presented to:            Status of service:  Completed, signed off  ED Comments:   ED Comments Detail:  EDCM spoke to patient's family at bedside.  Patient asleep. Patient's daughter Lendon Ka [phone] and patient's son Harvard Zeiss (c) [phone] (h) [phone]. Patient lives at home alone. Patient has a walker at home and a home made cane.  Patient's daughter reports he doesn't have "a real shower chair,"  we placed a fold up chiar in the shower for him because he refused a shower chair."  Patient's daughter reports Arville Go has just started coming out to see the patient this past Monday.  PT and OT saw patient on Tuesday, and an aide came to the house today to help him bathe.  Patient was seen at his urologist yesterday where they cahnged his foley catheter. Patient's daughter reports he is usually able to perform his own ADL's without difficulty.  Patient's daughter reports patient was discharged from hospital on 12/31 and sent to [facility] until Jan 21st.  He is now in his own home.  No further EDCM needs at this time.

## 2014-04-24 NOTE — ED Notes (Signed)
Foley catheter in place. Draining Dark Red blood

## 2014-04-24 NOTE — ED Notes (Addendum)
Pt found on floor by son today. Unsure if he hit his head. On Xarelto. Family reports last time patient fell his BP was found to be low. Patient's family says "he has been acting different." Has urinary catheter in "that has been draining red blood since the fall." Denies hx CHF. Speaking full/clear sentences. Confused at baseline.

## 2014-04-24 NOTE — ED Provider Notes (Signed)
CSN: [id number]     Arrival date & time 04/24/14  1917 History   First MD Initiated Contact with Patient 04/24/14 1938     Chief Complaint  Patient presents with  . Fall     (Consider location/radiation/quality/duration/timing/severity/associated sxs/prior Treatment) HPI  81 year old male presents after a fall. Lives alone, states he slid down against the wall, now his back is "sore". Denies hitting head. Recently put on xarelto for DVT. Family states he is always somewhat confused, not necessarily worse now. Has chronic foley, has now been having hematuria and darker urine, they are worried for recurrent UTI. Had his foley changed 1-2 days ago. Recently diagnosed with COPD, but no cough, dyspnea.   Past Medical History  Diagnosis Date  . Prostate cancer   . Aortic aneurysm    Past Surgical History  Procedure Laterality Date  . Abdominal surgery    . Hernia repair      double hernia  . Transurethral resection of prostate N/A 09/20/2013    Procedure: TRANSURETHRAL RESECTION OF THE PROSTATE ;  Surgeon: Jorja Loa, MD;  Location: [facility];  Service: Urology;  Laterality: N/A;   History reviewed. No pertinent family history. History  Substance Use Topics  . Smoking status: Former Smoker    Quit date: 10/07/1988  . Smokeless tobacco: Current User    Types: Chew  . Alcohol Use: No    Review of Systems  Constitutional: Negative for fever.  Respiratory: Negative for cough and shortness of breath.   Cardiovascular: Negative for chest pain.  Gastrointestinal: Negative for vomiting.  Genitourinary: Positive for hematuria.  Musculoskeletal: Positive for back pain.  Neurological: Negative for headaches.  All other systems reviewed and are negative.     Allergies  Codeine and Tamsulosin  Home Medications   Prior to Admission medications   Medication Sig Start Date End Date Taking? Authorizing Provider  Acetohydroxamic Acid (LITHOSTAT) 250 MG TABS Take 1 tablet by mouth  3 (three) times daily.    Historical Provider, MD  aspirin EC 81 MG tablet Take 81 mg by mouth daily.    Historical Provider, MD  cyanocobalamin 1000 MCG tablet Take 1 tablet (1,000 mcg total) by mouth daily. 03/28/14   Nishant Dhungel, MD  Rivaroxaban (XARELTO STARTER PACK) 15 & 20 MG TBPK Take as directed on package: Start with one 15mg  tablet by mouth twice a day with food. On Day 22, switch to one 20mg  tablet once a day with food. 03/28/14   Nishant Dhungel, MD   BP 83/50 mmHg  Pulse 127  Temp(Src) 98.3 F (36.8 C) (Oral)  Resp 16  SpO2 87% Physical Exam  Constitutional: He is oriented to person, place, and time. He appears well-developed and well-nourished.  HENT:  Head: Normocephalic and atraumatic.  Right Ear: External ear normal.  Left Ear: External ear normal.  Nose: Nose normal.  No obvious head trauma  Eyes: EOM are normal. Pupils are equal, round, and reactive to light. Right eye exhibits no discharge. Left eye exhibits no discharge.  Neck: Neck supple.  Cardiovascular: Normal rate, regular rhythm, normal heart sounds and intact distal pulses.   Pulmonary/Chest: Effort normal and breath sounds normal. He has no wheezes. He has no rales.  Abdominal: Soft. He exhibits no distension. There is no tenderness.  Genitourinary:  Foley catheter in place, hematuria  Musculoskeletal: He exhibits no edema.  Neurological: He is alert and oriented to person, place, and time.  Skin: Skin is warm and dry.  Nursing note and vitals reviewed.   ED Course  Procedures (including critical care time) Labs Review Labs Reviewed  COMPREHENSIVE METABOLIC PANEL - Abnormal; Notable for the following:    Glucose, Bld 166 (*)    BUN 28 (*)    Calcium 8.3 (*)    Albumin 3.0 (*)    GFR calc non Af Amer 49 (*)    GFR calc Af Amer 57 (*)    All other components within normal limits  CBC WITH DIFFERENTIAL/PLATELET - Abnormal; Notable for the following:    WBC 12.7 (*)    RBC 3.28 (*)     Hemoglobin 9.9 (*)    HCT 30.7 (*)    RDW 17.9 (*)    Neutrophils Relative % 92 (*)    Neutro Abs 11.6 (*)    Lymphocytes Relative 3 (*)    Lymphs Abs 0.3 (*)    All other components within normal limits  URINALYSIS, ROUTINE W REFLEX MICROSCOPIC - Abnormal; Notable for the following:    Color, Urine RED (*)    APPearance CLOUDY (*)    Hgb urine dipstick LARGE (*)    Bilirubin Urine LARGE (*)    Ketones, ur 40 (*)    Protein, ur >300 (*)    Nitrite POSITIVE (*)    Leukocytes, UA LARGE (*)    All other components within normal limits  I-STAT CG4 LACTIC ACID, ED - Abnormal; Notable for the following:    Lactic Acid, Venous 6.11 (*)    All other components within normal limits  I-STAT CHEM 8, ED - Abnormal; Notable for the following:    BUN 25 (*)    Creatinine, Ser 2.00 (*)    Glucose, Bld 170 (*)    Calcium, Ion 1.11 (*)    Hemoglobin 10.9 (*)    HCT 32.0 (*)    All other components within normal limits  I-STAT CG4 LACTIC ACID, ED - Abnormal; Notable for the following:    Lactic Acid, Venous 2.85 (*)    All other components within normal limits  CULTURE, BLOOD (ROUTINE X 2)  CULTURE, BLOOD (ROUTINE X 2)  URINE MICROSCOPIC-ADD ON  MAGNESIUM  PHOSPHORUS  TSH  COMPREHENSIVE METABOLIC PANEL  CBC  I-STAT CG4 LACTIC ACID, ED    Imaging Review Dg Chest 1 View  04/24/2014   CLINICAL DATA:  Found on floor. Confusion. Concern for chest injury. Initial encounter.  EXAM: CHEST - 1 VIEW  COMPARISON:  Chest radiograph performed 03/24/2014  FINDINGS: The lungs are well-aerated. Relatively stable patchy bilateral airspace opacities likely reflect chronic interstitial changes and fibrosis. No superimposed airspace consolidation is seen. No pleural effusion or pneumothorax is identified.  The cardiomediastinal silhouette is within normal limits. No acute osseous abnormalities are seen.  IMPRESSION: Relatively stable patchy bilateral airspace opacities likely reflect chronic interstitial  changes and fibrosis. No acute cardiopulmonary process seen. No displaced rib fractures identified.   Electronically Signed   By: Garald Balding M.D.   On: 04/24/2014 20:46   Dg Lumbar Spine Complete  04/24/2014   CLINICAL DATA:  Status post fall; acute onset of lower back pain. Initial encounter.  EXAM: LUMBAR SPINE - COMPLETE 4+ VIEW  COMPARISON:  Chest radiograph performed 03/07/2008  FINDINGS: There is compression deformity involving vertebral body T12, with approximately 60% loss of height. This is more likely chronic in nature, given minimal associated degenerative change, though it appears new from 2009.  Vertebral bodies otherwise demonstrate normal height and alignment. Intervertebral disc spaces  are preserved. Mild facet disease is noted at the lower lumbar spine.  The visualized bowel gas pattern is unremarkable in appearance; air and stool are noted within the colon. The sacroiliac joints are within normal limits. Scattered clips are noted along the course of the abdominal aorta and aortic bifurcation.  IMPRESSION: Compression deformity at T12, with approximately 60% loss of height. This is more likely chronic in nature, given minimal associated degenerative change, though it appears new from 2009. Would correlate for any associated symptoms.   Electronically Signed   By: Garald Balding M.D.   On: 04/24/2014 20:49   Ct Head Wo Contrast  04/24/2014   CLINICAL DATA:  Found on floor by son. Unsure if the head is head. Fall.  EXAM: CT HEAD WITHOUT CONTRAST  TECHNIQUE: Contiguous axial images were obtained from the base of the skull through the vertex without intravenous contrast.  COMPARISON:  None.  FINDINGS: There is mild diffuse low-attenuation within the subcortical and periventricular white matter compatible with chronic microvascular disease. There is prominence of the sulci and ventricles compatible with brain atrophy. No acute cortical infarct, hemorrhage, or mass lesion ispresent. No  significant extra-axial fluid collection is present. Asymmetric opacification of the left maxillary sinus noted. The remaining paranasal sinuses are clear. The mastoid air cells are clear. The osseous skull is intact.  IMPRESSION: 1. No acute intracranial abnormalities. 2. Small vessel ischemic change and brain atrophy. 3. Opacification of left maxillary sinus.   Electronically Signed   By: Kerby Moors M.D.   On: 04/24/2014 20:35     EKG Interpretation None      CRITICAL CARE Performed by: Sherwood Gambler T   Total critical care time: 30 minutes  Critical care time was exclusive of separately billable procedures and treating other patients.  Critical care was necessary to treat or prevent imminent or life-threatening deterioration.  Critical care was time spent personally by me on the following activities: development of treatment plan with patient and/or surrogate as well as nursing, discussions with consultants, evaluation of patient's response to treatment, examination of patient, obtaining history from patient or surrogate, ordering and performing treatments and interventions, ordering and review of laboratory studies, ordering and review of radiographic studies, pulse oximetry and re-evaluation of patient's condition.  MDM   Final diagnoses:  Fall, initial encounter  Severe sepsis UTI  Patient has low grade rectal temp, combined with elevated WBC and urine (difficult to assess given chronic foley), likely has sepsis. With lactate of 6 he qualifies for severe sepsis. BP initially hypotensive, responsive to fluids. Lactate trended down appropriately with fluids. Mild hypoxia initially as well, likely from sepsis. Did consider PE but no current dyspnea, is currently on anticoagulation. Discussed with hospitalist as well, she will see and decide if needs CTA. He is currently DNR but wants antibiotics and other treatment. Admit to stepdown.     Ephraim Hamburger, MD 04/25/14 [id number]

## 2014-04-24 NOTE — H&P (Signed)
PCP: Simona Huh, MD  [facility] [place]  Chief Complaint:  Found down  HPI: George Lara is a 81 y.o. male   has a past medical history of Prostate cancer and Aortic aneurysm.   Presented with  Family found patient on the floor at 5:3- pm Family noted that he had large amount of blood in foley and back pain. He supposed to use his walker but does not. In emergency department he was found to have low-grade fever and elevated white blood cell count of 12.7. Initial bite because it was elevated to 6 after patient received IV fluids lactic acid came down to 2.  Last month he was admitted for sepsis due to UTI and was found to have DVT. Patient was discharged on Xarelto. Family states that patient does not wish for over aggressive interventions. He apparently refused chemotherapy for his progressive prostate cancer. Patient had his Foley catheter replaced few days ago. Family denies prior history hematuria. Is unclear if the foley catheter got traumatized in any way during a fall. Patient lives alone. His family sees them on the regular basis. He is been getting confused and has been progressively getting worse   Hospitalist was called for admission for Sepsis and UTI  Review of Systems:    Pertinent positives include: blood in urine, confusion  Constitutional:  No weight loss, night sweats, Fevers, chills, fatigue, weight loss  HEENT:  No headaches, Difficulty swallowing,Tooth/dental problems,Sore throat,  No sneezing, itching, ear ache, nasal congestion, post nasal drip,  Cardio-vascular:  No chest pain, Orthopnea, PND, anasarca, dizziness, palpitations.no Bilateral lower extremity swelling  GI:  No heartburn, indigestion, abdominal pain, nausea, vomiting, diarrhea, change in bowel habits, loss of appetite, melena, blood in stool, hematemesis Resp:  no shortness of breath at rest. No dyspnea on exertion, No excess mucus, no productive cough, No non-productive cough, No  coughing up of blood.No change in color of mucus.No wheezing. Skin:  no rash or lesions. No jaundice GU:  no dysuria, change in color of urine, no urgency or frequency. No straining to urinate.  No flank pain.  Musculoskeletal:  No joint pain or no joint swelling. No decreased range of motion. No back pain.  Psych:  No change in mood or affect. No depression or anxiety. No memory loss.  Neuro: no localizing neurological complaints, no tingling, no weakness, no double vision, no gait abnormality, no slurred speech, no confusion  Otherwise ROS are negative except for above, 10 systems were reviewed  Past Medical History: Past Medical History  Diagnosis Date  . Prostate cancer   . Aortic aneurysm    Past Surgical History  Procedure Laterality Date  . Abdominal surgery    . Hernia repair      double hernia  . Transurethral resection of prostate N/A 09/20/2013    Procedure: TRANSURETHRAL RESECTION OF THE PROSTATE ;  Surgeon: Jorja Loa, MD;  Location: [facility];  Service: Urology;  Laterality: N/A;     Medications: Prior to Admission medications   Medication Sig Start Date End Date Taking? Authorizing Provider  Acetohydroxamic Acid (LITHOSTAT) 250 MG TABS Take 1 tablet by mouth 3 (three) times daily.   Yes Historical Provider, MD  cyanocobalamin 1000 MCG tablet Take 1 tablet (1,000 mcg total) by mouth daily. 03/28/14  Yes Nishant Dhungel, MD  rivaroxaban (XARELTO) 20 MG TABS tablet Take 20 mg by mouth daily with supper.   Yes Historical Provider, MD    Allergies:   Allergies  Allergen  Reactions  . Codeine Nausea And Vomiting  . Tamsulosin Other (See Comments)    "Passed out"    Social History:  Ambulatory  independently  Or walker cane, Lives at home alone,       reports that he quit smoking about 25 years ago. His smokeless tobacco use includes Chew. He reports that he does not drink alcohol or use illicit drugs.    Family History: family history includes CAD  in his brother; Dementia in his mother; Diabetes type II in his brother; Lung cancer in his father; Prostate cancer in his brother.    Physical Exam: Patient Vitals for the past 24 hrs:  BP Temp Temp src Pulse Resp SpO2 Weight  04/24/14 2230 (!) 107/44 mmHg - - 88 21 90 % -  04/24/14 2210 - - - - - - 62.143 kg (137 lb)  04/24/14 2200 (!) 96/47 mmHg - - 70 21 90 % -  04/24/14 2130 (!) 100/47 mmHg - - - 24 92 % -  04/24/14 2100 (!) 112/50 mmHg 100.5 F (38.1 C) Rectal (!) 50 21 90 % -  04/24/14 2040 (!) 105/54 mmHg - - 103 (!) 27 91 % -  04/24/14 2002 116/68 mmHg - - 114 (!) 27 93 % -  04/24/14 1930 (!) 83/50 mmHg - - - - - -  04/24/14 1929 - - - (!) 127 - - -  04/24/14 1924 (!) 93/51 mmHg 98.3 F (36.8 C) Oral 104 16 (!) 87 % -    1. General:  in No Acute distress 2. Psychological: Alert and  not Oriented 3. Head/ENT:    Dry Mucous Membranes                          Head Non traumatic, neck supple                           Poor Dentition 4. SKIN:   decreased Skin turgor,  Skin clean Dry and intact no rash 5. Heart: Regular rate and rhythm no Murmur, Rub or gallop 6. Lungs:  no wheezes some crackles   7. Abdomen: Soft, non-tender, Non distended 8. Lower extremities: no clubbing, cyanosis, or edema 9. Neurologically Grossly intact, moving all 4 extremities equally 10. MSK: Normal range of motion  body mass index is 20.84 kg/(m^2).   Labs on Admission:   Results for orders placed or performed during the hospital encounter of 04/24/14 (from the past 24 hour(s))  Comprehensive metabolic panel     Status: Abnormal   Collection Time: 04/24/14  7:49 PM  Result Value Ref Range   Sodium 136 135 - 145 mmol/L   Potassium 3.8 3.5 - 5.1 mmol/L   Chloride 102 96 - 112 mmol/L   CO2 19 19 - 32 mmol/L   Glucose, Bld 166 (H) 70 - 99 mg/dL   BUN 28 (H) 6 - 23 mg/dL   Creatinine, Ser 1.24 0.50 - 1.35 mg/dL   Calcium 8.3 (L) 8.4 - 10.5 mg/dL   Total Protein 6.7 6.0 - 8.3 g/dL   Albumin  3.0 (L) 3.5 - 5.2 g/dL   AST 27 0 - 37 U/L   ALT 15 0 - 53 U/L   Alkaline Phosphatase 93 39 - 117 U/L   Total Bilirubin 1.1 0.3 - 1.2 mg/dL   GFR calc non Af Amer 49 (L) >90 mL/min   GFR calc Af Amer 57 (L) >  90 mL/min   Anion gap 15 5 - 15  CBC with Differential     Status: Abnormal   Collection Time: 04/24/14  7:49 PM  Result Value Ref Range   WBC 12.7 (H) 4.0 - 10.5 K/uL   RBC 3.28 (L) 4.22 - 5.81 MIL/uL   Hemoglobin 9.9 (L) 13.0 - 17.0 g/dL   HCT 30.7 (L) 39.0 - 52.0 %   MCV 93.6 78.0 - 100.0 fL   MCH 30.2 26.0 - 34.0 pg   MCHC 32.2 30.0 - 36.0 g/dL   RDW 17.9 (H) 11.5 - 15.5 %   Platelets 208 150 - 400 K/uL   Neutrophils Relative % 92 (H) 43 - 77 %   Neutro Abs 11.6 (H) 1.7 - 7.7 K/uL   Lymphocytes Relative 3 (L) 12 - 46 %   Lymphs Abs 0.3 (L) 0.7 - 4.0 K/uL   Monocytes Relative 5 3 - 12 %   Monocytes Absolute 0.7 0.1 - 1.0 K/uL   Eosinophils Relative 0 0 - 5 %   Eosinophils Absolute 0.0 0.0 - 0.7 K/uL   Basophils Relative 0 0 - 1 %   Basophils Absolute 0.0 0.0 - 0.1 K/uL  Urinalysis, Routine w reflex microscopic     Status: Abnormal   Collection Time: 04/24/14  7:50 PM  Result Value Ref Range   Color, Urine RED (A) YELLOW   APPearance CLOUDY (A) CLEAR   Specific Gravity, Urine 1.013 1.005 - 1.030   pH 5.5 5.0 - 8.0   Glucose, UA NEGATIVE NEGATIVE mg/dL   Hgb urine dipstick LARGE (A) NEGATIVE   Bilirubin Urine LARGE (A) NEGATIVE   Ketones, ur 40 (A) NEGATIVE mg/dL   Protein, ur >300 (A) NEGATIVE mg/dL   Urobilinogen, UA 1.0 0.0 - 1.0 mg/dL   Nitrite POSITIVE (A) NEGATIVE   Leukocytes, UA LARGE (A) NEGATIVE  Urine microscopic-add on     Status: None   Collection Time: 04/24/14  7:50 PM  Result Value Ref Range   RBC / HPF FIELD OBSCURED BY RBC'S <3 RBC/hpf  I-Stat CG4 Lactic Acid, ED     Status: Abnormal   Collection Time: 04/24/14  8:04 PM  Result Value Ref Range   Lactic Acid, Venous 6.11 (HH) 0.5 - 2.0 mmol/L   Comment NOTIFIED PHYSICIAN   I-stat chem 8, ed      Status: Abnormal   Collection Time: 04/24/14  8:55 PM  Result Value Ref Range   Sodium 139 135 - 145 mmol/L   Potassium 3.8 3.5 - 5.1 mmol/L   Chloride 104 96 - 112 mmol/L   BUN 25 (H) 6 - 23 mg/dL   Creatinine, Ser 2.00 (H) 0.50 - 1.35 mg/dL   Glucose, Bld 170 (H) 70 - 99 mg/dL   Calcium, Ion 1.11 (L) 1.13 - 1.30 mmol/L   TCO2 18 0 - 100 mmol/L   Hemoglobin 10.9 (L) 13.0 - 17.0 g/dL   HCT 32.0 (L) 39.0 - 52.0 %  I-Stat CG4 Lactic Acid, ED     Status: Abnormal   Collection Time: 04/24/14 10:26 PM  Result Value Ref Range   Lactic Acid, Venous 2.85 (HH) 0.5 - 2.0 mmol/L    UA large amount of blood nitrite positive  No results found for: HGBA1C  Estimated Creatinine Clearance: 21.6 mL/min (by C-G formula based on Cr of 2).  BNP (last 3 results) No results for input(s): PROBNP in the last 8760 hours.  Other results:  I have pearsonaly reviewed this: ECG REPORT  Rate: 107  Rhythm: Sinus with frequent PACs ST&T Change: Nonischemic   Filed Weights   04/24/14 2210  Weight: 62.143 kg (137 lb)     Cultures:    Component Value Date/Time   SDES URINE, CLEAN CATCH 03/24/2014 2153   SPECREQUEST NONE 03/24/2014 2153   CULT  03/24/2014 2153    Multiple bacterial morphotypes present, none predominant. Suggest appropriate recollection if clinically indicated. Performed at [facility] 03/26/2014 FINAL 03/24/2014 2153     Radiological Exams on Admission: Dg Chest 1 View  04/24/2014   CLINICAL DATA:  Found on floor. Confusion. Concern for chest injury. Initial encounter.  EXAM: CHEST - 1 VIEW  COMPARISON:  Chest radiograph performed 03/24/2014  FINDINGS: The lungs are well-aerated. Relatively stable patchy bilateral airspace opacities likely reflect chronic interstitial changes and fibrosis. No superimposed airspace consolidation is seen. No pleural effusion or pneumothorax is identified.  The cardiomediastinal silhouette is within normal limits. No acute  osseous abnormalities are seen.  IMPRESSION: Relatively stable patchy bilateral airspace opacities likely reflect chronic interstitial changes and fibrosis. No acute cardiopulmonary process seen. No displaced rib fractures identified.   Electronically Signed   By: Garald Balding M.D.   On: 04/24/2014 20:46   Dg Lumbar Spine Complete  04/24/2014   CLINICAL DATA:  Status post fall; acute onset of lower back pain. Initial encounter.  EXAM: LUMBAR SPINE - COMPLETE 4+ VIEW  COMPARISON:  Chest radiograph performed 03/07/2008  FINDINGS: There is compression deformity involving vertebral body T12, with approximately 60% loss of height. This is more likely chronic in nature, given minimal associated degenerative change, though it appears new from 2009.  Vertebral bodies otherwise demonstrate normal height and alignment. Intervertebral disc spaces are preserved. Mild facet disease is noted at the lower lumbar spine.  The visualized bowel gas pattern is unremarkable in appearance; air and stool are noted within the colon. The sacroiliac joints are within normal limits. Scattered clips are noted along the course of the abdominal aorta and aortic bifurcation.  IMPRESSION: Compression deformity at T12, with approximately 60% loss of height. This is more likely chronic in nature, given minimal associated degenerative change, though it appears new from 2009. Would correlate for any associated symptoms.   Electronically Signed   By: Garald Balding M.D.   On: 04/24/2014 20:49   Ct Head Wo Contrast  04/24/2014   CLINICAL DATA:  Found on floor by son. Unsure if the head is head. Fall.  EXAM: CT HEAD WITHOUT CONTRAST  TECHNIQUE: Contiguous axial images were obtained from the base of the skull through the vertex without intravenous contrast.  COMPARISON:  None.  FINDINGS: There is mild diffuse low-attenuation within the subcortical and periventricular white matter compatible with chronic microvascular disease. There is prominence  of the sulci and ventricles compatible with brain atrophy. No acute cortical infarct, hemorrhage, or mass lesion ispresent. No significant extra-axial fluid collection is present. Asymmetric opacification of the left maxillary sinus noted. The remaining paranasal sinuses are clear. The mastoid air cells are clear. The osseous skull is intact.  IMPRESSION: 1. No acute intracranial abnormalities. 2. Small vessel ischemic change and brain atrophy. 3. Opacification of left maxillary sinus.   Electronically Signed   By: Kerby Moors M.D.   On: 04/24/2014 20:35    Chart has been reviewed  Assessment/Plan  81 year old gentleman with history of prostate cancer presents with hematuria after fall was found to have likely sepsis possibly secondary to UTI has a  chronic indwelling Foley  Present on Admission:   . Sepsis- given fever, tachycardia, elevated white blood cell count UA with positive nitrites some broad-spectrum antibiotics given IV fluids Urine is likely source although given hypotension we'll for now cover broadly  . UTI (urinary tract infection) due to urinary indwelling Foley catheter - continue broad-spectrum antibiotics await results of urine culture  . DVT of lower limb, acute - continue xarelto despite some hematuria for now. Given very recent DVT.Obtain renal US.  . Malignant neoplasm of prostate- per family he declined chemotherapy . Pulmonary fibrosis- chronic make sure he is on oxygen as needed,  . COPD (chronic obstructive pulmonary disease)  Prophylaxis: xarelto, Protonix  CODE STATUS:DNR/DNI avoid agressive interventions  Other plan as per orders.  I have spent a total of 55 min on this admission  Emalene Welte 04/24/2014, 10:36 PM  [facility]  Pager [phone]   after 2 AM please page floor coverage PA If 7AM-7PM, please contact the day team taking care of the patient  [email or web address]  Password [email or web address]

## 2014-04-24 NOTE — Progress Notes (Signed)
ANTIBIOTIC CONSULT NOTE - INITIAL  Pharmacy Consult for Vancomycin, Cefepime Indication: Sepsis  Allergies  Allergen Reactions  . Codeine Nausea And Vomiting  . Tamsulosin Other (See Comments)    "Passed out"    Patient Measurements: Weight: 137 lb (62.143 kg) Height: 5'8"  Vital Signs: Temp: 100.5 F (38.1 C) (01/27 2100) Temp Source: Rectal (01/27 2100) BP: 100/47 mmHg (01/27 2130) Pulse Rate: 50 (01/27 2100) Intake/Output from previous day:   Intake/Output from this shift:    Labs:  Recent Labs  04/24/14 1949 04/24/14 2055  WBC 12.7*  --   HGB 9.9* 10.9*  PLT 208  --   CREATININE 1.24 2.00*   Estimated Creatinine Clearance: 21.6 mL/min (by C-G formula based on Cr of 2). No results for input(s): VANCOTROUGH, VANCOPEAK, VANCORANDOM, GENTTROUGH, GENTPEAK, GENTRANDOM, TOBRATROUGH, TOBRAPEAK, TOBRARND, AMIKACINPEAK, AMIKACINTROU, AMIKACIN in the last 72 hours.   Microbiology: No results found for this or any previous visit (from the past 720 hour(s)).  Medical History: Past Medical History  Diagnosis Date  . Prostate cancer   . Aortic aneurysm     Assessment: 53 y/oM with PMH of prostate cancer, aortic aneurysm, and hx of DVT on Xarelto who presents to [facility] ED after fall. Foley catheter draining dark red blood. Pharmacy consulted to assist with dosing of Cefepime and Vancomycin for sepsis.  1/27 >> Cefepime >> 1/27 >> Vancomycin >>    Tmax: 100.5 WBCs: elevated, 12.7K Renal: AKI, SCr 2, CrCl ~ 22 mL/min CG Lactic Acid: 6.11  1/27 blood x 2: sent  Goal of Therapy:  Vancomycin trough level 15-20 mcg/ml  Appropriate antibiotic dosing for renal function and indication Eradication of infection  Plan:   Cefepime 2 grams IV x 1, then 1 gram IV q24h  Vancomycin 1250 mg IV x 1, then 750 mg IV q24h.  Plan for Vancomycin trough level at steady state.  BMET in AM.  Monitor renal function, cultures, clinical course.   Lindell Spar, PharmD, BCPS Pager:  [phone] 04/24/2014 10:19 PM

## 2014-04-25 DIAGNOSIS — A419 Sepsis, unspecified organism: Secondary | ICD-10-CM

## 2014-04-25 DIAGNOSIS — N179 Acute kidney failure, unspecified: Secondary | ICD-10-CM

## 2014-04-25 DIAGNOSIS — E43 Unspecified severe protein-calorie malnutrition: Secondary | ICD-10-CM

## 2014-04-25 DIAGNOSIS — Z9889 Other specified postprocedural states: Secondary | ICD-10-CM

## 2014-04-25 DIAGNOSIS — J438 Other emphysema: Secondary | ICD-10-CM

## 2014-04-25 LAB — COMPREHENSIVE METABOLIC PANEL
ALK PHOS: 73 U/L (ref 39–117)
ALT: 13 U/L (ref 0–53)
AST: 17 U/L (ref 0–37)
Albumin: 2.5 g/dL — ABNORMAL LOW (ref 3.5–5.2)
Anion gap: 9 (ref 5–15)
BUN: 26 mg/dL — AB (ref 6–23)
CALCIUM: 8.1 mg/dL — AB (ref 8.4–10.5)
CHLORIDE: 107 mmol/L (ref 96–112)
CO2: 24 mmol/L (ref 19–32)
Creatinine, Ser: 1 mg/dL (ref 0.50–1.35)
GFR calc Af Amer: 74 mL/min — ABNORMAL LOW (ref >90)
GFR calc non Af Amer: 64 mL/min — ABNORMAL LOW (ref >90)
Glucose, Bld: 140 mg/dL — ABNORMAL HIGH (ref 70–99)
POTASSIUM: 4 mmol/L (ref 3.5–5.1)
SODIUM: 140 mmol/L (ref 135–145)
Total Bilirubin: 1.4 mg/dL — ABNORMAL HIGH (ref 0.3–1.2)
Total Protein: 5.9 g/dL — ABNORMAL LOW (ref 6.0–8.3)

## 2014-04-25 LAB — CBC
HEMATOCRIT: 33.7 % — AB (ref 39.0–52.0)
HEMOGLOBIN: 10.8 g/dL — AB (ref 13.0–17.0)
MCH: 30 pg (ref 26.0–34.0)
MCHC: 32 g/dL (ref 30.0–36.0)
MCV: 93.6 fL (ref 78.0–100.0)
Platelets: 167 10*3/uL (ref 150–400)
RBC: 3.6 MIL/uL — ABNORMAL LOW (ref 4.22–5.81)
RDW: 17.9 % — AB (ref 11.5–15.5)
WBC: 9.9 10*3/uL (ref 4.0–10.5)

## 2014-04-25 LAB — TSH: TSH: 6.825 u[IU]/mL — ABNORMAL HIGH (ref 0.350–4.500)

## 2014-04-25 LAB — PHOSPHORUS: PHOSPHORUS: 3.6 mg/dL (ref 2.3–4.6)

## 2014-04-25 LAB — MAGNESIUM: Magnesium: 2 mg/dL (ref 1.5–2.5)

## 2014-04-25 LAB — URINE MICROSCOPIC-ADD ON

## 2014-04-25 MED ORDER — SODIUM CHLORIDE 0.9 % IV SOLN
INTRAVENOUS | Status: AC
  Administered 2014-04-25 (×2): via INTRAVENOUS

## 2014-04-25 MED ORDER — IPRATROPIUM BROMIDE 0.02 % IN SOLN: 0.5000 mg | Freq: Four times a day (QID) | RESPIRATORY_TRACT | Status: DC | PRN

## 2014-04-25 MED ORDER — ACETAMINOPHEN 325 MG PO TABS: 650.0000 mg | ORAL_TABLET | Freq: Four times a day (QID) | ORAL | Status: DC | PRN

## 2014-04-25 MED ORDER — IPRATROPIUM BROMIDE 0.02 % IN SOLN
0.5000 mg | Freq: Four times a day (QID) | RESPIRATORY_TRACT | Status: DC
  Administered 2014-04-25: 0.5 mg via RESPIRATORY_TRACT
  Filled 2014-04-25: qty 2.5

## 2014-04-25 MED ORDER — ACETAMINOPHEN 650 MG RE SUPP: 650.0000 mg | Freq: Four times a day (QID) | RECTAL | Status: DC | PRN

## 2014-04-25 MED ORDER — ONDANSETRON HCL 4 MG PO TABS: 4.0000 mg | ORAL_TABLET | Freq: Four times a day (QID) | ORAL | Status: DC | PRN

## 2014-04-25 MED ORDER — DOCUSATE SODIUM 100 MG PO CAPS
100.0000 mg | ORAL_CAPSULE | Freq: Two times a day (BID) | ORAL | Status: DC
  Administered 2014-04-25 – 2014-04-29 (×8): 100 mg via ORAL

## 2014-04-25 MED ORDER — ALBUTEROL SULFATE (2.5 MG/3ML) 0.083% IN NEBU: 2.5000 mg | INHALATION_SOLUTION | RESPIRATORY_TRACT | Status: DC | PRN

## 2014-04-25 MED ORDER — GUAIFENESIN ER 600 MG PO TB12
600.0000 mg | ORAL_TABLET | Freq: Two times a day (BID) | ORAL | Status: DC
  Administered 2014-04-25: 600 mg via ORAL
  Filled 2014-04-25 (×3): qty 1

## 2014-04-25 MED ORDER — HYDROCODONE-ACETAMINOPHEN 5-325 MG PO TABS
1.0000 | ORAL_TABLET | ORAL | Status: DC | PRN
  Administered 2014-04-25 – 2014-04-29 (×5): 1 via ORAL
  Filled 2014-04-25 (×5): qty 1

## 2014-04-25 MED ORDER — CETYLPYRIDINIUM CHLORIDE 0.05 % MT LIQD
7.0000 mL | Freq: Two times a day (BID) | OROMUCOSAL | Status: DC
  Administered 2014-04-25 – 2014-04-28 (×4): 7 mL via OROMUCOSAL

## 2014-04-25 MED ORDER — ONDANSETRON HCL 4 MG/2ML IJ SOLN: 4.0000 mg | Freq: Four times a day (QID) | INTRAMUSCULAR | Status: DC | PRN

## 2014-04-25 NOTE — Plan of Care (Signed)
Problem: Phase I Progression Outcomes Goal: Voiding-avoid urinary catheter unless indicated Outcome: Not Met (add Reason) Pt has chronic foley     

## 2014-04-25 NOTE — Progress Notes (Signed)
[facility] Progress Note   ARNETT DUDDY POE:[id number] DOB: 10/04/1921 DOA: 04/24/2014 PCP: Simona Huh, MD  Brief narrative: George Lara is a 81 y.o. male presenting to the hospital for fall and lethargy. Also noted to have a low grade fever and hematuria. Per family he has stopped eating and drinking and now is sleeping all day. She was found to have dehydration and acute renal failure (pre-renal).    Subjective: Alert. Asked me to let him go home to his wife who is dead. Family points out his desire to die and refusal to eat/ drink or perform any activities. This has been going on for about 1 month or more. I have discussed hospice with them and they are in agreement to discharge to to SNF with hospice in place.   Assessment/Plan: Principal Problem:   UTI (urinary tract infection) due to urinary indwelling Foley catheter//  Sepsis - f/u on culture results and continue Vanc and Maxipime for now  Active Problems:   AKI (acute kidney injury) - due to refusal to eat and drink - improved-    Malignant neoplasm of prostate - the patient has declined treatment for this and therefore has a foley    COPD (chronic obstructive pulmonary disease) - appears stable    Chronic indwelling Foley catheter - as mentioned above    DVT of lower limb, acute - cont Xarelto for this    Protein-calorie malnutrition, severe- failure to thrive - very emaciated - have discussed that his prognosis is extremely poor as he presented with dehydration as a result of poor PO intake- they understand that he will continue to decline especially as he has mentally given up - they have agreed that hospice care is appropriate at this time-      Code Status: DNR Family Communication: with daughter and son Disposition Plan: SNF with hospice in place DVT prophylaxis: Xarelto  Consultants: none  Procedures: none  Antibiotics: Anti-infectives    Start     Dose/Rate Route  Frequency Ordered Stop   04/25/14 2300  ceFEPIme (MAXIPIME) 1 g in dextrose 5 % 50 mL IVPB     1 g100 mL/hr over 30 Minutes Intravenous Every 24 hours 04/24/14 2213     04/25/14 2200  vancomycin (VANCOCIN) IVPB 750 mg/150 ml premix     750 mg150 mL/hr over 60 Minutes Intravenous Every 24 hours 04/24/14 2212     04/25/14 0600  ceFEPIme (MAXIPIME) 1 g in dextrose 5 % 50 mL IVPB  Status:  Discontinued     1 g100 mL/hr over 30 Minutes Intravenous 3 times per day 04/24/14 2209 04/24/14 2212   04/24/14 2130  ceFEPIme (MAXIPIME) 2 g in dextrose 5 % 50 mL IVPB     2 g100 mL/hr over 30 Minutes Intravenous STAT 04/24/14 2125 04/25/14 0040   04/24/14 2130  vancomycin (VANCOCIN) 1,250 mg in sodium chloride 0.9 % 250 mL IVPB     1,250 mg166.7 mL/hr over 90 Minutes Intravenous STAT 04/24/14 2127 04/24/14 2322         Objective: Filed Weights   04/24/14 2210 04/25/14 0022  Weight: 62.143 kg (137 lb) 62.3 kg (137 lb 5.6 oz)    Intake/Output Summary (Last 24 hours) at 04/25/14 1630 Last data filed at 04/25/14 1300  Gross per 24 hour  Intake 3416.66 ml  Output   1950 ml  Net 1466.66 ml     Vitals Filed Vitals:   04/25/14 0927 04/25/14 1022 04/25/14 1023 04/25/14 1330  BP: 110/62   106/49  Pulse: 79   95  Temp: 97.7 F (36.5 C)   98.1 F (36.7 C)  TempSrc: Oral   Oral  Resp: 22   18  Height:      Weight:      SpO2: 98% 89% 98% 96%    Exam: General: AAO x3, No acute respiratory distress Lungs: Clear to auscultation bilaterally without wheezes or crackles Cardiovascular: Regular rate and rhythm without murmur gallop or rub normal S1 and S2 Abdomen: Nontender, nondistended, soft, bowel sounds positive, no rebound, no ascites, no appreciable mass Extremities: No significant cyanosis, clubbing, or edema bilateral lower extremities  Data Reviewed: Basic Metabolic Panel:  Recent Labs Lab 04/24/14 1949 04/24/14 2055 04/25/14 0537  NA 136 139 140  K 3.8 3.8 4.0  CL 102 104 107   CO2 19  --  24  GLUCOSE 166* 170* 140*  BUN 28* 25* 26*  CREATININE 1.24 2.00* 1.00  CALCIUM 8.3*  --  8.1*  MG  --   --  2.0  PHOS  --   --  3.6   Liver Function Tests:  Recent Labs Lab 04/24/14 1949 04/25/14 0537  AST 27 17  ALT 15 13  ALKPHOS 93 73  BILITOT 1.1 1.4*  PROT 6.7 5.9*  ALBUMIN 3.0* 2.5*   No results for input(s): LIPASE, AMYLASE in the last 168 hours. No results for input(s): AMMONIA in the last 168 hours. CBC:  Recent Labs Lab 04/24/14 1949 04/24/14 2055 04/25/14 0537  WBC 12.7*  --  9.9  NEUTROABS 11.6*  --   --   HGB 9.9* 10.9* 10.8*  HCT 30.7* 32.0* 33.7*  MCV 93.6  --  93.6  PLT 208  --  167   Cardiac Enzymes: No results for input(s): CKTOTAL, CKMB, CKMBINDEX, TROPONINI in the last 168 hours. BNP (last 3 results) No results for input(s): PROBNP in the last 8760 hours. CBG: No results for input(s): GLUCAP in the last 168 hours.  No results found for this or any previous visit (from the past 240 hour(s)).   Studies:  Recent x-ray studies have been reviewed in detail by the Attending Physician  Scheduled Meds:  Scheduled Meds: . antiseptic oral rinse  7 mL Mouth Rinse BID  . ceFEPime (MAXIPIME) IV  1 g Intravenous Q24H  . docusate sodium  100 mg Oral BID  . vancomycin  750 mg Intravenous Q24H   Continuous Infusions:   Time spent on care of this patient: 45 min   [place], MD 04/25/2014, 4:30 PM  LOS: 1 day   [facility] Office  [phone] Pager - Text Page per [email or web address]  If 7PM-7AM, please contact night-coverage [email or web address]

## 2014-04-25 NOTE — Progress Notes (Signed)
Clinical Social Work Department CLINICAL SOCIAL WORK PLACEMENT NOTE 04/25/2014  Patient:  George Lara, George Lara  Account Number:  [id number] Admit date:  04/24/2014  Clinical Social Worker:  Werner Lean, LCSW  Date/time:  04/25/2014 02:24 PM  Clinical Social Work is seeking post-discharge placement for this patient at the following level of care:   SKILLED NURSING   (*CSW will update this form in Epic as items are completed)     Patient/family provided with [facility] Department of Clinical Social Work's list of facilities offering this level of care within the geographic area requested by the patient (or if unable, by the patient's family).  04/25/2014  Patient/family informed of their freedom to choose among providers that offer the needed level of care, that participate in Medicare, Medicaid or managed care program needed by the patient, have an available bed and are willing to accept the patient.    Patient/family informed of [facility]' ownership interest in [facility], as well as of the fact that they are under no obligation to receive care at this facility.  PASARR submitted to EDS on 04/25/2014 PASARR number received on 04/25/2014  FL2 transmitted to all facilities in geographic area requested by pt/family on  04/25/2014 FL2 transmitted to all facilities within larger geographic area on   Patient informed that his/her managed care company has contracts with or will negotiate with  certain facilities, including the following:     Patient/family informed of bed offers received:  04/25/2014 Patient chooses bed at [facility] Physician recommends and patient chooses bed at    Patient to be transferred to  on   Patient to be transferred to facility by  Patient and family notified of transfer on  Name of family member notified:    The following physician request were entered in Epic:   Additional Comments:    Werner Lean LCSW [phone]

## 2014-04-25 NOTE — Progress Notes (Signed)
Clinical Social Work Department BRIEF PSYCHOSOCIAL ASSESSMENT 04/25/2014  Patient:  George Lara, George Lara     Account Number:  [id number]     Admit date:  04/24/2014  Clinical Social Worker:  Lacie Scotts  Date/Time:  04/25/2014 01:55 PM  Referred by:  Physician  Date Referred:  04/25/2014 Referred for  SNF Placement   Other Referral:   Interview type:  Patient Other interview type:    PSYCHOSOCIAL DATA Living Status:  ALONE Admitted from facility:   Level of care:   Primary support name:  Bernadene Person Primary support relationship to patient:  CHILD, ADULT Degree of support available:   supportive    CURRENT CONCERNS Current Concerns  Post-Acute Placement   Other Concerns:    SOCIAL WORK ASSESSMENT / PLAN Pt is a 81 yr old gentleman living home, alone, prior to hospitalization. Pt had recently been hospitalized, then d/c to [facility] for rehab. Pt was d/c from SNF about a week ago. Daughter found pt on the floor when checking on him. Pt is being treated for UTI / Sepsis / DVT. Pt has a hx of prostate ca. He has declined treatment for ca. Hospitalist has discussed  treatment plan with pt / family. [facility]osp[facility] Care at  [facility] has been chosen by pt / family. SNF has provided bed offer provided no PICC line / IV's ordered at d/c. This will be reviewed with MD. CSW will continue to follow to assist with d/c planning.   Assessment/plan status:  Psychosocial Support/Ongoing Assessment of Needs Other assessment/ plan:   Information/referral to community resources:   Insurance coverage for SNF vs Hospice care. Insurance coverage for ambulance transport reviewed.    PATIENT'S/FAMILY'S RESPONSE TO PLAN OF CARE: Pt agreed with plan for SNF placement during csw visit. Pt's daughter / son feel that returning home with 23 / 7 care is not a feasible plan. Pt does not want aggressive care. Pt / family feel hospice care is most appropriate plan at this time.   George Lean  LCSW [phone]

## 2014-04-25 NOTE — Care Management Note (Signed)
    Page 1 of 1   04/25/2014     2:54:43 PM CARE MANAGEMENT NOTE 04/25/2014  Patient:  DEMETRIE, BORGE   Account Number:  [id number]  Date Initiated:  04/25/2014  Documentation initiated by:  [facility]  Subjective/Objective Assessment:   adm: hematuria after fall was found to have likely sepsis possibly secondary to UTI has a chronic indwelling Foley     Action/Plan:   discharge planning   Anticipated DC Date:  04/27/2014   Anticipated DC Plan:  SKILLED NURSING FACILITY         Choice offered to / List presented to:             Status of service:  Completed, signed off Medicare Important Message given?   (If response is "NO", the following Medicare IM given date fields will be blank) Date Medicare IM given:   Medicare IM given by:   Date Additional Medicare IM given:   Additional Medicare IM given by:    Discharge Disposition:  [place]  Per UR Regulation:  Reviewed for med. necessity/level of care/duration of stay  If discussed at [place] of Stay Meetings, dates discussed:    Comments:  04/25/14 14:50 CM notes pt to go to SNF; CSW arranging.  No other CM needs were communicated.  Mariane Masters, BSN, CM [phone].

## 2014-04-26 MED ORDER — RIVAROXABAN 20 MG PO TABS
20.0000 mg | ORAL_TABLET | Freq: Every day | ORAL | Status: DC
  Filled 2014-04-26: qty 1

## 2014-04-26 NOTE — Progress Notes (Addendum)
[facility] Progress Note   George Lara INO:[id number] DOB: 1921/05/06 DOA: 04/24/2014 PCP: Simona Huh, MD  Brief narrative: George Lara is a 81 y.o. male presenting to the hospital for fall and lethargy. Also noted to have a low grade fever and hematuria. Per family he has stopped eating and drinking and now is sleeping all day. She was found to have dehydration and acute renal failure (pre-renal).    Subjective: Alert and in a good mood today. No complaints.   Assessment/Plan: Principal Problem:   UTI (urinary tract infection) due to urinary indwelling Foley catheter//  Sepsis - awaiting U culture - one set of blood cx pos for gr pos cocci-  continue Vanc and Maxipime for now  Active Problems:   AKI (acute kidney injury) - due to refusal to eat and drink - improved with IVF-    Malignant neoplasm of prostate - the patient has declined treatment for this and therefore has a foley    COPD (chronic obstructive pulmonary disease) - appears stable    Chronic indwelling Foley catheter - as mentioned above    DVT of lower limb, acute - Xarelto on hold due to severe hematuria- will likely be able to resume tomorrow    Protein-calorie malnutrition, severe- failure to thrive - very emaciated - have discussed that his prognosis is extremely poor as he presented with dehydration as a result of poor PO intake- they understand that he will continue to decline especially as he has mentally given up - they have agreed that hospice care is appropriate at this time-      Code Status: DNR Family Communication: with daughter and son Disposition Plan: SNF with hospice in place DVT prophylaxis: Xarelto  Consultants: none  Procedures: none  Antibiotics: Anti-infectives    Start     Dose/Rate Route Frequency Ordered Stop   04/25/14 2300  ceFEPIme (MAXIPIME) 1 g in dextrose 5 % 50 mL IVPB     1 g100 mL/hr over 30 Minutes Intravenous Every 24 hours 04/24/14  2213     04/25/14 2200  vancomycin (VANCOCIN) IVPB 750 mg/150 ml premix     750 mg150 mL/hr over 60 Minutes Intravenous Every 24 hours 04/24/14 2212     04/25/14 0600  ceFEPIme (MAXIPIME) 1 g in dextrose 5 % 50 mL IVPB  Status:  Discontinued     1 g100 mL/hr over 30 Minutes Intravenous 3 times per day 04/24/14 2209 04/24/14 2212   04/24/14 2130  ceFEPIme (MAXIPIME) 2 g in dextrose 5 % 50 mL IVPB     2 g100 mL/hr over 30 Minutes Intravenous STAT 04/24/14 2125 04/25/14 0040   04/24/14 2130  vancomycin (VANCOCIN) 1,250 mg in sodium chloride 0.9 % 250 mL IVPB     1,250 mg166.7 mL/hr over 90 Minutes Intravenous STAT 04/24/14 2127 04/24/14 2322         Objective: Filed Weights   04/24/14 2210 04/25/14 0022  Weight: 62.143 kg (137 lb) 62.3 kg (137 lb 5.6 oz)    Intake/Output Summary (Last 24 hours) at 04/26/14 1017 Last data filed at 04/26/14 0845  Gross per 24 hour  Intake    840 ml  Output   2750 ml  Net  -1910 ml     Vitals Filed Vitals:   04/25/14 1800 04/25/14 2310 04/26/14 0150 04/26/14 0540  BP: 123/62 115/77 118/74 110/57  Pulse: 91 42 84 74  Temp: 98 F (36.7 C) 98.4 F (36.9 C) 98.7 F (37.1 C) 97.7  F (36.5 C)  TempSrc: Oral Oral Oral Oral  Resp: 20 20 16 20   Height:      Weight:      SpO2: 92% 90% 92% 95%    Exam: General: AAO x3, No acute respiratory distress Lungs: Clear to auscultation bilaterally without wheezes or crackles Cardiovascular: Regular rate and rhythm without murmur gallop or rub normal S1 and S2 Abdomen: Nontender, nondistended, soft, bowel sounds positive, no rebound, no ascites, no appreciable mass Extremities: No significant cyanosis, clubbing, or edema bilateral lower extremities  Data Reviewed: Basic Metabolic Panel:  Recent Labs Lab 04/24/14 1949 04/24/14 2055 04/25/14 0537  NA 136 139 140  K 3.8 3.8 4.0  CL 102 104 107  CO2 19  --  24  GLUCOSE 166* 170* 140*  BUN 28* 25* 26*  CREATININE 1.24 2.00* 1.00  CALCIUM 8.3*  --   8.1*  MG  --   --  2.0  PHOS  --   --  3.6   Liver Function Tests:  Recent Labs Lab 04/24/14 1949 04/25/14 0537  AST 27 17  ALT 15 13  ALKPHOS 93 73  BILITOT 1.1 1.4*  PROT 6.7 5.9*  ALBUMIN 3.0* 2.5*   No results for input(s): LIPASE, AMYLASE in the last 168 hours. No results for input(s): AMMONIA in the last 168 hours. CBC:  Recent Labs Lab 04/24/14 1949 04/24/14 2055 04/25/14 0537  WBC 12.7*  --  9.9  NEUTROABS 11.6*  --   --   HGB 9.9* 10.9* 10.8*  HCT 30.7* 32.0* 33.7*  MCV 93.6  --  93.6  PLT 208  --  167   Cardiac Enzymes: No results for input(s): CKTOTAL, CKMB, CKMBINDEX, TROPONINI in the last 168 hours. BNP (last 3 results) No results for input(s): PROBNP in the last 8760 hours. CBG: No results for input(s): GLUCAP in the last 168 hours.  Recent Results (from the past 240 hour(s))  Culture, blood (routine x 2)     Status: None (Preliminary result)   Collection Time: 04/24/14  9:27 PM  Result Value Ref Range Status   Specimen Description BLOOD LEFT HAND  Final   Special Requests BOTTLES DRAWN AEROBIC AND ANAEROBIC 5CC  Final   Culture   Final    GRAM POSITIVE COCCI IN CLUSTERS Note: Gram Stain Report Called to,Read Back By and Verified With: [facility] VENNABLE RN 58P Performed at [facility]    Report Status PENDING  Incomplete  Culture, blood (routine x 2)     Status: None (Preliminary result)   Collection Time: 04/24/14  9:29 PM  Result Value Ref Range Status   Specimen Description BLOOD RIGHT HAND  Final   Special Requests BOTTLES DRAWN AEROBIC AND ANAEROBIC 5CC  Final   Culture   Final           BLOOD CULTURE RECEIVED NO GROWTH TO DATE CULTURE WILL BE HELD FOR 5 DAYS BEFORE ISSUING A FINAL NEGATIVE REPORT Performed at [facility]    Report Status PENDING  Incomplete     Studies:  Recent x-ray studies have been reviewed in detail by the Attending Physician  Scheduled Meds:  Scheduled Meds: . antiseptic oral rinse  7 mL  Mouth Rinse BID  . ceFEPime (MAXIPIME) IV  1 g Intravenous Q24H  . docusate sodium  100 mg Oral BID  . vancomycin  750 mg Intravenous Q24H   Continuous Infusions:   Time spent on care of this patient: 45 min   Rheems,  MD 04/26/2014, 10:17 AM  LOS: 2 days   [facility] Office  [phone] Pager - Text Page per [email or web address]  If 7PM-7AM, please contact night-coverage [email or web address]

## 2014-04-26 NOTE — Evaluation (Signed)
Physical Therapy Evaluation Patient Details Name: ENGELBERT SEVIN MRN: [id number] DOB: [birth year] Today's Date: 04/26/2014   History of Present Illness  81 y.o. male with a Past Medical History of prostate cancer with a chronic Foley catheter in place admitted for UTI s/p fall at home  Clinical Impression  Pt admitted s/p fall at home with UTI and with functional mobility limitations 2* limited endurance, ambulatory instability and memory deficits.  Pt would benefit from follow up rehab at SNF level.    Follow Up Recommendations SNF    Equipment Recommendations  None recommended by PT    Recommendations for Other Services       Precautions / Restrictions Precautions Precautions: Fall Restrictions Weight Bearing Restrictions: No Other Position/Activity Restrictions: WBAT      Mobility  Bed Mobility Overal bed mobility: Needs Assistance Bed Mobility: Supine to Sit     Supine to sit: Min assist     General bed mobility comments: Pt utilizing bed rails to move to EOB  Transfers Overall transfer level: Needs assistance Equipment used: Rolling walker (2 wheeled) Transfers: Sit to/from Stand Sit to Stand: Min assist         General transfer comment: cues for transition position and use of UEs to self assist; min assist to steady on initial standing  Ambulation/Gait Ambulation/Gait assistance: Min assist Ambulation Distance (Feet): 147 Feet Assistive device: Rolling walker (2 wheeled) Gait Pattern/deviations: Step-through pattern;Shuffle;Trunk flexed Gait velocity: pt cued to slow pace 2* increasing instability with increased speed   General Gait Details: cues for pace, posture and position from RW for saftey  Stairs            Wheelchair Mobility    Modified Rankin (Stroke Patients Only)       Balance                                             Pertinent Vitals/Pain Pain Assessment: Faces Faces Pain Scale: Hurts little  more Pain Location: low back Pain Descriptors / Indicators: Aching Pain Intervention(s): Limited activity within patient's tolerance;Monitored during session    Home Living Family/patient expects to be discharged to:: Skilled nursing facility                      Prior Function Level of Independence: Independent with assistive device(s)               Hand Dominance   Dominant Hand: Right    Extremity/Trunk Assessment   Upper Extremity Assessment: Overall WFL for tasks assessed           Lower Extremity Assessment: Overall WFL for tasks assessed      Cervical / Trunk Assessment: Kyphotic  Communication   Communication: HOH  Cognition Arousal/Alertness: Awake/alert Behavior During Therapy: WFL for tasks assessed/performed Overall Cognitive Status: History of cognitive impairments - at baseline       Memory: Decreased short-term memory              General Comments      Exercises        Assessment/Plan    PT Assessment Patient needs continued PT services  PT Diagnosis Difficulty walking   PT Problem List Decreased strength;Decreased range of motion;Decreased activity tolerance;Decreased mobility;Decreased balance;Decreased knowledge of use of DME;Pain;Decreased safety awareness  PT Treatment Interventions DME instruction;Gait training;Functional mobility training;Therapeutic activities;Therapeutic exercise;Patient/family education  PT Goals (Current goals can be found in the Care Plan section) Acute Rehab PT Goals Patient Stated Goal: No specific goals stated.  Pt states he is "ready to go be with his wife" PT Goal Formulation: With patient Time For Goal Achievement: 05/03/14 Potential to Achieve Goals: Fair    Frequency Min 3X/week   Barriers to discharge        Co-evaluation               End of Session Equipment Utilized During Treatment: Gait belt Activity Tolerance: Patient tolerated treatment well;Patient limited by  fatigue Patient left: in chair;with call bell/phone within reach;with family/visitor present Nurse Communication: Mobility status         Time: 1105-1130 PT Time Calculation (min) (ACUTE ONLY): 25 min   Charges:   PT Evaluation $Initial PT Evaluation Tier I: 1 Procedure PT Treatments $Gait Training: 8-22 mins   PT G Codes:        Javari Bufkin 05/23/14, 12:49 PM

## 2014-04-26 NOTE — Discharge Summary (Signed)
Physician Discharge Summary  [name] HER:[id number] DOB: July 08, 1921 DOA: 04/24/2014  PCP: Simona Huh, MD  Admit date: 04/24/2014 Discharge date: 04/26/2014  Time spent: 45 minutes  Recommendations for Outpatient Follow-up:  1. Transfer to SNF with hospice  Discharge Condition: stable Diet recommendation: as tolerated- regular diet  Discharge Diagnoses:  Principal Problem:   UTI (urinary tract infection) due to urinary indwelling Foley catheter Active Problems:   AKI (acute kidney injury)   Malignant neoplasm of prostate   COPD (chronic obstructive pulmonary disease)   Chronic indwelling Foley catheter   DVT of lower limb, acute   Pulmonary fibrosis   Protein-calorie malnutrition, severe   Sepsis   History of present illness:  George Lara is a 81 y.o. male presenting to the hospital for fall and lethargy. Also noted to have a low grade fever and hematuria. Per family he has stopped eating and drinking and now is sleeping all day. He was found to have dehydration and acute renal failure (pre-renal).   Hospital Course:  Principal Problem:  UTI (urinary tract infection) due to urinary indwelling Foley catheter// Sepsis - UA positive- urine culture still negative - one set of blood cx pos for gr pos cocci - continue Vanc and Maxipime for now- final meds to be decided after cultures resulted.   Active Problems:  AKI (acute kidney injury) - due to refusal to eat and drink - improved with IVF-   Malignant neoplasm of prostate - the patient has declined treatment for this and therefore has a foley   COPD (chronic obstructive pulmonary disease) - appears stable   Chronic indwelling Foley catheter - as mentioned above   DVT of lower limb, acute - Xarelto on hold due to severe hematuria- will likely be able to resume tomorrow   Protein-calorie malnutrition, severe- failure to thrive - very emaciated - have discussed that his prognosis is  extremely poor as he presented with dehydration as a result of poor PO intake- they understand that he will continue to decline especially as he has mentally given up - they have agreed that hospice care is appropriate at this time-     Discharge Exam: Filed Weights   04/24/14 2210 04/25/14 0022  Weight: 62.143 kg (137 lb) 62.3 kg (137 lb 5.6 oz)   Filed Vitals:   04/26/14 0540  BP: 110/57  Pulse: 74  Temp: 97.7 F (36.5 C)  Resp: 20    General: AAO x 3, no distress Cardiovascular: RRR, no murmurs  Respiratory: clear to auscultation bilaterally GI: soft, non-tender, non-distended, bowel sound positive  Discharge Instructions You were cared for by a hospitalist during your hospital stay. If you have any questions about your discharge medications or the care you received while you were in the hospital after you are discharged, you can call the unit and asked to speak with the hospitalist on call if the hospitalist that took care of you is not available. Once you are discharged, your primary care physician will handle any further medical issues. Please note that NO REFILLS for any discharge medications will be authorized once you are discharged, as it is imperative that you return to your primary care physician (or establish a relationship with a primary care physician if you do not have one) for your aftercare needs so that they can reassess your need for medications and monitor your lab values.     Medication List    TAKE these medications        cyanocobalamin  1000 MCG tablet  Take 1 tablet (1,000 mcg total) by mouth daily.     LITHOSTAT 250 MG Tabs  Generic drug:  Acetohydroxamic Acid  Take 1 tablet by mouth 3 (three) times daily.     rivaroxaban 20 MG Tabs tablet  Commonly known as:  XARELTO  Take 20 mg by mouth daily with supper.       Allergies  Allergen Reactions  . Codeine Nausea And Vomiting  . Tamsulosin Other (See Comments)    "Passed out"      The  results of significant diagnostics from this hospitalization (including imaging, microbiology, ancillary and laboratory) are listed below for reference.    Significant Diagnostic Studies: Dg Chest 1 View  04/24/2014   CLINICAL DATA:  Found on floor. Confusion. Concern for chest injury. Initial encounter.  EXAM: CHEST - 1 VIEW  COMPARISON:  Chest radiograph performed 03/24/2014  FINDINGS: The lungs are well-aerated. Relatively stable patchy bilateral airspace opacities likely reflect chronic interstitial changes and fibrosis. No superimposed airspace consolidation is seen. No pleural effusion or pneumothorax is identified.  The cardiomediastinal silhouette is within normal limits. No acute osseous abnormalities are seen.  IMPRESSION: Relatively stable patchy bilateral airspace opacities likely reflect chronic interstitial changes and fibrosis. No acute cardiopulmonary process seen. No displaced rib fractures identified.   Electronically Signed   By: Garald Balding M.D.   On: 04/24/2014 20:46   Dg Lumbar Spine Complete  04/24/2014   CLINICAL DATA:  Status post fall; acute onset of lower back pain. Initial encounter.  EXAM: LUMBAR SPINE - COMPLETE 4+ VIEW  COMPARISON:  Chest radiograph performed 03/07/2008  FINDINGS: There is compression deformity involving vertebral body T12, with approximately 60% loss of height. This is more likely chronic in nature, given minimal associated degenerative change, though it appears new from 2009.  Vertebral bodies otherwise demonstrate normal height and alignment. Intervertebral disc spaces are preserved. Mild facet disease is noted at the lower lumbar spine.  The visualized bowel gas pattern is unremarkable in appearance; air and stool are noted within the colon. The sacroiliac joints are within normal limits. Scattered clips are noted along the course of the abdominal aorta and aortic bifurcation.  IMPRESSION: Compression deformity at T12, with approximately 60% loss of  height. This is more likely chronic in nature, given minimal associated degenerative change, though it appears new from 2009. Would correlate for any associated symptoms.   Electronically Signed   By: Garald Balding M.D.   On: 04/24/2014 20:49   Ct Head Wo Contrast  04/24/2014   CLINICAL DATA:  Found on floor by son. Unsure if the head is head. Fall.  EXAM: CT HEAD WITHOUT CONTRAST  TECHNIQUE: Contiguous axial images were obtained from the base of the skull through the vertex without intravenous contrast.  COMPARISON:  None.  FINDINGS: There is mild diffuse low-attenuation within the subcortical and periventricular white matter compatible with chronic microvascular disease. There is prominence of the sulci and ventricles compatible with brain atrophy. No acute cortical infarct, hemorrhage, or mass lesion ispresent. No significant extra-axial fluid collection is present. Asymmetric opacification of the left maxillary sinus noted. The remaining paranasal sinuses are clear. The mastoid air cells are clear. The osseous skull is intact.  IMPRESSION: 1. No acute intracranial abnormalities. 2. Small vessel ischemic change and brain atrophy. 3. Opacification of left maxillary sinus.   Electronically Signed   By: Kerby Moors M.D.   On: 04/24/2014 20:35    Microbiology: Recent Results (from the  past 240 hour(s))  Culture, blood (routine x 2)     Status: None (Preliminary result)   Collection Time: 04/24/14  9:27 PM  Result Value Ref Range Status   Specimen Description BLOOD LEFT HAND  Final   Special Requests BOTTLES DRAWN AEROBIC AND ANAEROBIC 5CC  Final   Culture   Final    GRAM POSITIVE COCCI IN CLUSTERS Note: Gram Stain Report Called to,Read Back By and Verified With: [facility] VENNABLE RN 73P Performed at [facility]    Report Status PENDING  Incomplete  Culture, blood (routine x 2)     Status: None (Preliminary result)   Collection Time: 04/24/14  9:29 PM  Result Value Ref Range Status    Specimen Description BLOOD RIGHT HAND  Final   Special Requests BOTTLES DRAWN AEROBIC AND ANAEROBIC 5CC  Final   Culture   Final           BLOOD CULTURE RECEIVED NO GROWTH TO DATE CULTURE WILL BE HELD FOR 5 DAYS BEFORE ISSUING A FINAL NEGATIVE REPORT Performed at [facility]    Report Status PENDING  Incomplete     Labs: Basic Metabolic Panel:  Recent Labs Lab 04/24/14 1949 04/24/14 2055 04/25/14 0537  NA 136 139 140  K 3.8 3.8 4.0  CL 102 104 107  CO2 19  --  24  GLUCOSE 166* 170* 140*  BUN 28* 25* 26*  CREATININE 1.24 2.00* 1.00  CALCIUM 8.3*  --  8.1*  MG  --   --  2.0  PHOS  --   --  3.6   Liver Function Tests:  Recent Labs Lab 04/24/14 1949 04/25/14 0537  AST 27 17  ALT 15 13  ALKPHOS 93 73  BILITOT 1.1 1.4*  PROT 6.7 5.9*  ALBUMIN 3.0* 2.5*   No results for input(s): LIPASE, AMYLASE in the last 168 hours. No results for input(s): AMMONIA in the last 168 hours. CBC:  Recent Labs Lab 04/24/14 1949 04/24/14 2055 04/25/14 0537  WBC 12.7*  --  9.9  NEUTROABS 11.6*  --   --   HGB 9.9* 10.9* 10.8*  HCT 30.7* 32.0* 33.7*  MCV 93.6  --  93.6  PLT 208  --  167   Cardiac Enzymes: No results for input(s): CKTOTAL, CKMB, CKMBINDEX, TROPONINI in the last 168 hours. BNP: BNP (last 3 results) No results for input(s): PROBNP in the last 8760 hours. CBG: No results for input(s): GLUCAP in the last 168 hours.     SignedDebbe Odea, MD [facility] 04/26/2014, 2:17 PM

## 2014-04-26 NOTE — Progress Notes (Signed)
Pt alert and oriented. Daughter at bedside with pt and plans to be with pt for most of the day. Pt and family update on plan of care for patient. Pt has a bed reserved at [facility] in [place]. Pt to go there for comfort care at discharge. Family updated on process for admission if this were to happen today. Plan of care for today is unsure of at this moment. Awaiting admitting physician to arrive to discuss plan of care for pt. Pt understanding of plan as well as daughter. All questions answered appropriately.

## 2014-04-26 NOTE — Plan of Care (Signed)
Problem: Phase I Progression Outcomes Goal: Initial discharge plan identified Outcome: Progressing Pt may be discharged today pending results from Blood cultures. Discussed this with admitting physician. If no results are shown, pt to be discharged tomorrow.

## 2014-04-27 DIAGNOSIS — I82419 Acute embolism and thrombosis of unspecified femoral vein: Secondary | ICD-10-CM

## 2014-04-27 NOTE — Progress Notes (Signed)
[facility] Progress Note   George Lara DTO:[id number] DOB: May 29, 1921 DOA: 04/24/2014 PCP: Simona Huh, MD  Brief narrative: George Lara is a 81 y.o. male presenting to the hospital for fall and lethargy. Also noted to have a low grade fever and hematuria. Per family he has stopped eating and drinking and now is sleeping all day. She was found to have dehydration and acute renal failure (pre-renal).    Subjective: Sitting up in bed, alert. He finished all of his breakfast this morning which is surprising. Complains of on and off back pain. Not willing to get out of bed or sit in a chair.  Assessment/Plan: Principal Problem:   UTI (urinary tract infection) due to urinary indwelling Foley catheter//  Sepsis - awaiting U culture- staph aureus 70,000 colonies are growing, sensitivities pending - one set of blood cx pos for calf arias as well  continue Vanc - DC Maxipime -await sensitivities  Active Problems:   AKI (acute kidney injury) - due to refusal to eat and drink - improved with IVF-    Malignant neoplasm of prostate - the patient has declined treatment for this and therefore has a foley    COPD (chronic obstructive pulmonary disease) - appears stable    Chronic indwelling Foley catheter with hematuria -Continue to follow hematuria-suspected is secondary to trauma    DVT of lower limb, acute - Xarelto on hold due to severe hematuria which is continuing-small clots also noted in the urine -we will continue to hold Xarelto until hematuria improves     Protein-calorie malnutrition, severe- failure to thrive - very emaciated - have discussed that his prognosis is extremely poor as he presented with dehydration as a result of poor PO intake- they understand that he will continue to decline especially as he has mentally given up - they have agreed that hospice care is appropriate at this time-      Code Status: DNR Family Communication: with daughter  and son Disposition Plan: SNF with hospice in place DVT prophylaxis: Xarelto  Consultants: none  Procedures: none  Antibiotics: Anti-infectives    Start     Dose/Rate Route Frequency Ordered Stop   04/25/14 2300  ceFEPIme (MAXIPIME) 1 g in dextrose 5 % 50 mL IVPB     1 g100 mL/hr over 30 Minutes Intravenous Every 24 hours 04/24/14 2213     04/25/14 2200  vancomycin (VANCOCIN) IVPB 750 mg/150 ml premix     750 mg150 mL/hr over 60 Minutes Intravenous Every 24 hours 04/24/14 2212     04/25/14 0600  ceFEPIme (MAXIPIME) 1 g in dextrose 5 % 50 mL IVPB  Status:  Discontinued     1 g100 mL/hr over 30 Minutes Intravenous 3 times per day 04/24/14 2209 04/24/14 2212   04/24/14 2130  ceFEPIme (MAXIPIME) 2 g in dextrose 5 % 50 mL IVPB     2 g100 mL/hr over 30 Minutes Intravenous STAT 04/24/14 2125 04/25/14 0040   04/24/14 2130  vancomycin (VANCOCIN) 1,250 mg in sodium chloride 0.9 % 250 mL IVPB     1,250 mg166.7 mL/hr over 90 Minutes Intravenous STAT 04/24/14 2127 04/24/14 2322         Objective: Filed Weights   04/24/14 2210 04/25/14 0022  Weight: 62.143 kg (137 lb) 62.3 kg (137 lb 5.6 oz)    Intake/Output Summary (Last 24 hours) at 04/27/14 1346 Last data filed at 04/27/14 1309  Gross per 24 hour  Intake   1160 ml  Output  1875 ml  Net   -715 ml     Vitals Filed Vitals:   04/26/14 1431 04/26/14 2229 04/27/14 0558 04/27/14 1338  BP: 112/60 100/70 112/63 107/55  Pulse: 77 75 72 92  Temp: 97.3 F (36.3 C) 98.4 F (36.9 C) 97.2 F (36.2 C) 98 F (36.7 C)  TempSrc: Oral Oral Axillary Oral  Resp: 20 18 18 20   Height:      Weight:      SpO2: 96% 96% 93% 93%    Exam: General: AAO x3, No acute respiratory distress Lungs: Clear to auscultation bilaterally without wheezes or crackles Cardiovascular: Regular rate and rhythm without murmur gallop or rub normal S1 and S2 Abdomen: Nontender, nondistended, soft, bowel sounds positive, no rebound, no ascites, no appreciable  mass Extremities: No significant cyanosis, clubbing, or edema bilateral lower extremities  Data Reviewed: Basic Metabolic Panel:  Recent Labs Lab 04/24/14 1949 04/24/14 2055 04/25/14 0537  NA 136 139 140  K 3.8 3.8 4.0  CL 102 104 107  CO2 19  --  24  GLUCOSE 166* 170* 140*  BUN 28* 25* 26*  CREATININE 1.24 2.00* 1.00  CALCIUM 8.3*  --  8.1*  MG  --   --  2.0  PHOS  --   --  3.6   Liver Function Tests:  Recent Labs Lab 04/24/14 1949 04/25/14 0537  AST 27 17  ALT 15 13  ALKPHOS 93 73  BILITOT 1.1 1.4*  PROT 6.7 5.9*  ALBUMIN 3.0* 2.5*   No results for input(s): LIPASE, AMYLASE in the last 168 hours. No results for input(s): AMMONIA in the last 168 hours. CBC:  Recent Labs Lab 04/24/14 1949 04/24/14 2055 04/25/14 0537  WBC 12.7*  --  9.9  NEUTROABS 11.6*  --   --   HGB 9.9* 10.9* 10.8*  HCT 30.7* 32.0* 33.7*  MCV 93.6  --  93.6  PLT 208  --  167   Cardiac Enzymes: No results for input(s): CKTOTAL, CKMB, CKMBINDEX, TROPONINI in the last 168 hours. BNP (last 3 results) No results for input(s): PROBNP in the last 8760 hours. CBG: No results for input(s): GLUCAP in the last 168 hours.  Recent Results (from the past 240 hour(s))  Culture, blood (routine x 2)     Status: None (Preliminary result)   Collection Time: 04/24/14  9:27 PM  Result Value Ref Range Status   Specimen Description BLOOD LEFT HAND  Final   Special Requests BOTTLES DRAWN AEROBIC AND ANAEROBIC 5CC  Final   Culture   Final    STAPHYLOCOCCUS AUREUS Note: RIFAMPIN AND GENTAMICIN SHOULD NOT BE USED AS SINGLE DRUGS FOR TREATMENT OF STAPH INFECTIONS. Note: Gram Stain Report Called to,Read Back By and Verified With: [facility] VENNABLE RN 54P Performed at [facility]    Report Status PENDING  Incomplete  Culture, blood (routine x 2)     Status: None (Preliminary result)   Collection Time: 04/24/14  9:29 PM  Result Value Ref Range Status   Specimen Description BLOOD RIGHT HAND  Final    Special Requests BOTTLES DRAWN AEROBIC AND ANAEROBIC 5CC  Final   Culture   Final           BLOOD CULTURE RECEIVED NO GROWTH TO DATE CULTURE WILL BE HELD FOR 5 DAYS BEFORE ISSUING A FINAL NEGATIVE REPORT Performed at [facility]    Report Status PENDING  Incomplete  Urine culture     Status: None (Preliminary result)   Collection  Time: 04/25/14  9:09 AM  Result Value Ref Range Status   Specimen Description URINE, CATHETERIZED  Final   Special Requests Normal  Final   Colony Count   Final    70,000 COLONIES/ML Performed at [facility]    Culture   Final    STAPHYLOCOCCUS AUREUS Note: RIFAMPIN AND GENTAMICIN SHOULD NOT BE USED AS SINGLE DRUGS FOR TREATMENT OF STAPH INFECTIONS. Performed at [facility]    Report Status PENDING  Incomplete     Studies:  Recent x-ray studies have been reviewed in detail by the Attending Physician  Scheduled Meds:  Scheduled Meds: . antiseptic oral rinse  7 mL Mouth Rinse BID  . ceFEPime (MAXIPIME) IV  1 g Intravenous Q24H  . docusate sodium  100 mg Oral BID  . vancomycin  750 mg Intravenous Q24H   Continuous Infusions:   Time spent on care of this patient: 35 min   Waldorf, MD 04/27/2014, 1:46 PM  LOS: 3 days   [facility] Office  [phone] Pager - Text Page per [email or web address]  If 7PM-7AM, please contact night-coverage [email or web address]

## 2014-04-27 NOTE — Clinical Social Work Note (Signed)
CSW called and spoke with RN to assess pt discharge today and RN stated she would check with MD  RN called CSW back to state that Pt was ready for discharge today  CSW called [facility] facility to let them know that pt was ready for discharge and faxed discharge summary  RN called CSW back and stated that MD wanted to wait for blood cultures to come back before discharging pt and he might be ready tomorrow  CSW called facility back to let them know pt would not be discharging today  CSW will continue to monitor pt for needs until discharge  .Dede Query, LCSW [facility] Clinical Social Worker - Weekend Coverage cell #: [phone]

## 2014-04-27 NOTE — Progress Notes (Signed)
[place] for Vancomycin, Cefepime Indication: Sepsis  Allergies  Allergen Reactions  . Codeine Nausea And Vomiting  . Tamsulosin Other (See Comments)    "Passed out"    Patient Measurements: Height: 5\' 9"  (175.3 cm) (stated) Weight: 137 lb 5.6 oz (62.3 kg) IBW/kg (Calculated) : 70.7 Height: 5'8"  Vital Signs: Temp: 97.2 F (36.2 C) (01/30 0558) Temp Source: Axillary (01/30 0558) BP: 112/63 mmHg (01/30 0558) Pulse Rate: 72 (01/30 0558) Intake/Output from previous day: 01/29 0701 - 01/30 0700 In: 1080 [P.O.:840] Out: 2100 [Urine:2100] Intake/Output from this shift: Total I/O In: 600 [P.O.:600] Out: 375 [Urine:375]  Labs:  Recent Labs  04/24/14 1949 04/24/14 2055 04/25/14 0537  WBC 12.7*  --  9.9  HGB 9.9* 10.9* 10.8*  PLT 208  --  167  CREATININE 1.24 2.00* 1.00   Estimated Creatinine Clearance: 43.3 mL/min (by C-G formula based on Cr of 1). No results for input(s): VANCOTROUGH, VANCOPEAK, VANCORANDOM, GENTTROUGH, GENTPEAK, GENTRANDOM, TOBRATROUGH, TOBRAPEAK, TOBRARND, AMIKACINPEAK, AMIKACINTROU, AMIKACIN in the last 72 hours.   Microbiology: Recent Results (from the past 720 hour(s))  Culture, blood (routine x 2)     Status: None (Preliminary result)   Collection Time: 04/24/14  9:27 PM  Result Value Ref Range Status   Specimen Description BLOOD LEFT HAND  Final   Special Requests BOTTLES DRAWN AEROBIC AND ANAEROBIC 5CC  Final   Culture   Final    STAPHYLOCOCCUS AUREUS Note: RIFAMPIN AND GENTAMICIN SHOULD NOT BE USED AS SINGLE DRUGS FOR TREATMENT OF STAPH INFECTIONS. Note: Gram Stain Report Called to,Read Back By and Verified With: [facility] VENNABLE RN 61P Performed at [facility]    Report Status PENDING  Incomplete  Culture, blood (routine x 2)     Status: None (Preliminary result)   Collection Time: 04/24/14  9:29 PM  Result Value Ref Range Status   Specimen Description BLOOD RIGHT HAND  Final   Special  Requests BOTTLES DRAWN AEROBIC AND ANAEROBIC 5CC  Final   Culture   Final           BLOOD CULTURE RECEIVED NO GROWTH TO DATE CULTURE WILL BE HELD FOR 5 DAYS BEFORE ISSUING A FINAL NEGATIVE REPORT Performed at [facility]    Report Status PENDING  Incomplete  Urine culture     Status: None (Preliminary result)   Collection Time: 04/25/14  9:09 AM  Result Value Ref Range Status   Specimen Description URINE, CATHETERIZED  Final   Special Requests Normal  Final   Colony Count   Final    70,000 COLONIES/ML Performed at [facility]    Culture   Final    STAPHYLOCOCCUS AUREUS Note: RIFAMPIN AND GENTAMICIN SHOULD NOT BE USED AS SINGLE DRUGS FOR TREATMENT OF STAPH INFECTIONS. Performed at [facility]    Report Status PENDING  Incomplete     Assessment: 63 y/oM with PMH of prostate cancer, aortic aneurysm, and hx of DVT on Xarelto who presents to [facility] ED after fall. Foley catheter draining dark red blood. Pharmacy consulted to assist with dosing of Cefepime and Vancomycin for sepsis.  1/27 >> Cefepime >> 1/27 >> Vancomycin >>   Tmax: improved to afebrile 1/28 WBCs: improved to WNL 1/28 Renal: AKI resolved, SCr 2.0>1.0, CrCl 43 CG Lactic Acid: 6.11>2.85  1/27 blood x 2: S. Aureus 1/2 1/28 urine: S. aureus  Goal of Therapy:  Vancomycin trough level 15-20 mcg/ml  Appropriate antibiotic dosing for renal function and  indication Eradication of infection  Plan:  Day #3 vancomycin/cefepime  Continue vancomycin 750 mg IV q24h.  Continue cefepime 1gm IV q24h  Await final susceptibilities and overall plan to determine if trough indicated  Plan is hospice at SNF - f/u aggressiveness of treatment   Doreene Eland, PharmD, BCPS.   Pager: [phone] 04/27/2014 1:26 PM

## 2014-04-28 DIAGNOSIS — Z22322 Carrier or suspected carrier of Methicillin resistant Staphylococcus aureus: Secondary | ICD-10-CM

## 2014-04-28 DIAGNOSIS — B9562 Methicillin resistant Staphylococcus aureus infection as the cause of diseases classified elsewhere: Secondary | ICD-10-CM

## 2014-04-28 LAB — BASIC METABOLIC PANEL
Anion gap: 7 (ref 5–15)
BUN: 20 mg/dL (ref 6–23)
CALCIUM: 7.9 mg/dL — AB (ref 8.4–10.5)
CHLORIDE: 105 mmol/L (ref 96–112)
CO2: 26 mmol/L (ref 19–32)
Creatinine, Ser: 0.75 mg/dL (ref 0.50–1.35)
GFR calc non Af Amer: 78 mL/min — ABNORMAL LOW (ref >90)
Glucose, Bld: 131 mg/dL — ABNORMAL HIGH (ref 70–99)
POTASSIUM: 3.4 mmol/L — AB (ref 3.5–5.1)
Sodium: 138 mmol/L (ref 135–145)

## 2014-04-28 LAB — URINE CULTURE
Colony Count: 70000
SPECIAL REQUESTS: NORMAL

## 2014-04-28 LAB — T4, FREE: FREE T4: 0.72 ng/dL — AB (ref 0.80–1.80)

## 2014-04-28 LAB — CULTURE, BLOOD (ROUTINE X 2)

## 2014-04-28 LAB — VANCOMYCIN, TROUGH: Vancomycin Tr: 7.9 ug/mL — ABNORMAL LOW (ref 10.0–20.0)

## 2014-04-28 MED ORDER — POTASSIUM CHLORIDE CRYS ER 20 MEQ PO TBCR
40.0000 meq | EXTENDED_RELEASE_TABLET | Freq: Once | ORAL | Status: AC
  Administered 2014-04-28: 40 meq via ORAL
  Filled 2014-04-28: qty 2

## 2014-04-28 MED ORDER — BISACODYL 5 MG PO TBEC
10.0000 mg | DELAYED_RELEASE_TABLET | Freq: Every day | ORAL | Status: DC
  Administered 2014-04-28 – 2014-04-29 (×2): 10 mg via ORAL
  Filled 2014-04-28 (×2): qty 2

## 2014-04-28 MED ORDER — VANCOMYCIN HCL IN DEXTROSE 750-5 MG/150ML-% IV SOLN
750.0000 mg | Freq: Two times a day (BID) | INTRAVENOUS | Status: DC
  Filled 2014-04-28 (×2): qty 150

## 2014-04-28 NOTE — Consult Note (Signed)
[place] for Infectious Disease     Reason for Consult:MRSA bacteremia    Referring Physician: Dr. Norval Morton  Principal Problem:   UTI (urinary tract infection) due to urinary indwelling Foley catheter Active Problems:   Malignant neoplasm of prostate   COPD (chronic obstructive pulmonary disease)   Chronic indwelling Foley catheter   DVT of lower limb, acute   Pulmonary fibrosis   Protein-calorie malnutrition, severe   Sepsis   AKI (acute kidney injury)   MRSA (methicillin resistant staph aureus) culture positive   . antiseptic oral rinse  7 mL Mouth Rinse BID  . bisacodyl  10 mg Oral Daily  . docusate sodium  100 mg Oral BID  . potassium chloride  40 mEq Oral Once  . vancomycin  750 mg Intravenous Q24H    Recommendations: To do TTE Repeat blood cultures picc with negative blood cultures at 48 hours 4 weeks of IV vancomycin   Assessment: He has MRSA bacteremia.  Was going to hospice but now somewhat improved and so will continue treatment and will go to SNF with palliative care.     Antibiotics: vancomycin  HPI: George Lara is a 81 y.o. male with FTT, fever and now MRSA bacteremia.  Family does not want aggressive care since he has been declining.  Some improvement though during hospitalization.  Has prostate cancer.     Review of Systems: Review of systems not obtained due to patient factors.  Past Medical History  Diagnosis Date  . Prostate cancer   . Aortic aneurysm     History  Substance Use Topics  . Smoking status: Former Smoker    Quit date: 10/07/1988  . Smokeless tobacco: Current User    Types: Chew  . Alcohol Use: No    Family History  Problem Relation Age of Onset  . Dementia Mother   . Lung cancer Father   . CAD Brother   . Diabetes type II Brother   . Prostate cancer Brother    Allergies  Allergen Reactions  . Codeine Nausea And Vomiting  . Tamsulosin Other (See Comments)    "Passed out"     OBJECTIVE: Blood pressure 97/50, pulse 71, temperature 98.2 F (36.8 C), temperature source Oral, resp. rate 18, height 5\' 9"  (1.753 m), weight 137 lb 5.6 oz (62.3 kg), SpO2 96 %.   Microbiology: Recent Results (from the past 240 hour(s))  Culture, blood (routine x 2)     Status: None   Collection Time: 04/24/14  9:27 PM  Result Value Ref Range Status   Specimen Description BLOOD LEFT HAND  Final   Special Requests BOTTLES DRAWN AEROBIC AND ANAEROBIC 5CC  Final   Culture   Final    METHICILLIN RESISTANT STAPHYLOCOCCUS AUREUS Note: RIFAMPIN AND GENTAMICIN SHOULD NOT BE USED AS SINGLE DRUGS FOR TREATMENT OF STAPH INFECTIONS. CRITICAL RESULT CALLED TO, READ BACK BY AND VERIFIED WITH: KIM [facility] 04/28/14 @ 9:33AM BY RUSCOE A. Note: Gram Stain Report Called to,Read Back By and Verified With: Jarvis Morgan RN 800P Performed at [facility]    Report Status 04/28/2014 FINAL  Final   Organism ID, Bacteria METHICILLIN RESISTANT STAPHYLOCOCCUS AUREUS  Final      Susceptibility   Methicillin resistant staphylococcus aureus - MIC*    CLINDAMYCIN >=8 RESISTANT Resistant     ERYTHROMYCIN >=8 RESISTANT Resistant     GENTAMICIN <=0.5 SENSITIVE Sensitive     LEVOFLOXACIN >=8 RESISTANT Resistant     OXACILLIN >=4 RESISTANT Resistant  PENICILLIN >=0.5 RESISTANT Resistant     RIFAMPIN <=0.5 SENSITIVE Sensitive     TRIMETH/SULFA <=10 SENSITIVE Sensitive     VANCOMYCIN 1 SENSITIVE Sensitive     TETRACYCLINE <=1 SENSITIVE Sensitive     * METHICILLIN RESISTANT STAPHYLOCOCCUS AUREUS  Culture, blood (routine x 2)     Status: None (Preliminary result)   Collection Time: 04/24/14  9:29 PM  Result Value Ref Range Status   Specimen Description BLOOD RIGHT HAND  Final   Special Requests BOTTLES DRAWN AEROBIC AND ANAEROBIC 5CC  Final   Culture   Final           BLOOD CULTURE RECEIVED NO GROWTH TO DATE CULTURE WILL BE HELD FOR 5 DAYS BEFORE ISSUING A FINAL NEGATIVE REPORT Performed at  [facility]    Report Status PENDING  Incomplete  Urine culture     Status: None (Preliminary result)   Collection Time: 04/25/14  9:09 AM  Result Value Ref Range Status   Specimen Description URINE, CATHETERIZED  Final   Special Requests Normal  Final   Colony Count   Final    70,000 COLONIES/ML Performed at [facility]    Culture   Final    STAPHYLOCOCCUS AUREUS Note: RIFAMPIN AND GENTAMICIN SHOULD NOT BE USED AS SINGLE DRUGS FOR TREATMENT OF STAPH INFECTIONS. Performed at [place], [place] for Infectious [facility] [employer] [email or web address] [phone] pager  [phone] cell 04/28/2014, 11:24 AM

## 2014-04-28 NOTE — Progress Notes (Signed)
Lab call with +MRSA results in blood culture, Dr. Wynelle Cleveland on floor and notified.  Orders r'cvd.  Christen Bame RN

## 2014-04-28 NOTE — Progress Notes (Signed)
[facility] Progress Note   IZAIA SAY DGL:[id number] DOB: 1921-10-18 DOA: 04/24/2014 PCP: Simona Huh, MD  Brief narrative: George Lara is a 81 y.o. male presenting to the hospital for fall and lethargy. Also noted to have a low grade fever and hematuria. Per family he has stopped eating and drinking and now is sleeping all day. She was found to have dehydration and acute renal failure (pre-renal).    Subjective: Having on and off lower back pain.  Assessment/Plan: Principal Problem:   UTI (urinary tract infection) due to urinary indwelling Foley catheter//  Sepsis - awaiting U culture- staph aureus 70,000 colonies are growing, sensitivities pending - one set of blood cx pos for MRSA-  continue Vanc - repeat blood cx- order PICC for tomorrow as long as blood cx negative- obtain ECHO - DC Maxipime   Active Problems:   AKI (acute kidney injury) - due to refusal to eat and drink - improved with IVF-    Malignant neoplasm of prostate - the patient has declined treatment for this and therefore has a foley    COPD (chronic obstructive pulmonary disease) - appears stable    Chronic indwelling Foley catheter with hematuria -Continue to follow hematuria-suspected is secondary to trauma    DVT of lower limb, acute - Xarelto on hold due to severe hematuria which is continuing-small clots also noted in the urine but now improving -we will continue to hold Xarelto until hematuria resolves    Protein-calorie malnutrition, severe- failure to thrive - very emaciated - have discussed that his prognosis is extremely poor as he presented with dehydration as a result of poor PO intake- they understand that he will continue to decline especially as he has mentally given up  - they have agreed that palliative care at nursing home is appropriate at this time-      Code Status: DNR Family Communication: with daughter and son Disposition Plan: SNF with hospice in  place DVT prophylaxis: Xarelto  Consultants: none  Procedures: none  Antibiotics: Anti-infectives    Start     Dose/Rate Route Frequency Ordered Stop   04/25/14 2300  ceFEPIme (MAXIPIME) 1 g in dextrose 5 % 50 mL IVPB  Status:  Discontinued     1 g100 mL/hr over 30 Minutes Intravenous Every 24 hours 04/24/14 2213 04/28/14 1057   04/25/14 2200  vancomycin (VANCOCIN) IVPB 750 mg/150 ml premix     750 mg150 mL/hr over 60 Minutes Intravenous Every 24 hours 04/24/14 2212     04/25/14 0600  ceFEPIme (MAXIPIME) 1 g in dextrose 5 % 50 mL IVPB  Status:  Discontinued     1 g100 mL/hr over 30 Minutes Intravenous 3 times per day 04/24/14 2209 04/24/14 2212   04/24/14 2130  ceFEPIme (MAXIPIME) 2 g in dextrose 5 % 50 mL IVPB     2 g100 mL/hr over 30 Minutes Intravenous STAT 04/24/14 2125 04/25/14 0040   04/24/14 2130  vancomycin (VANCOCIN) 1,250 mg in sodium chloride 0.9 % 250 mL IVPB     1,250 mg166.7 mL/hr over 90 Minutes Intravenous STAT 04/24/14 2127 04/24/14 2322         Objective: Filed Weights   04/24/14 2210 04/25/14 0022  Weight: 62.143 kg (137 lb) 62.3 kg (137 lb 5.6 oz)    Intake/Output Summary (Last 24 hours) at 04/28/14 1111 Last data filed at 04/28/14 1000  Gross per 24 hour  Intake    720 ml  Output   2400 ml  Net  -  1680 ml     Vitals Filed Vitals:   04/27/14 0558 04/27/14 1338 04/27/14 2139 04/28/14 0659  BP: 112/63 107/55 114/70 97/50  Pulse: 72 92 72 71  Temp: 97.2 F (36.2 C) 98 F (36.7 C) 98.9 F (37.2 C) 98.2 F (36.8 C)  TempSrc: Axillary Oral Oral Oral  Resp: 18 20 18 18   Height:      Weight:      SpO2: 93% 93% 93% 96%    Exam: General: AAO x3, No acute respiratory distress Lungs: Clear to auscultation bilaterally without wheezes or crackles Cardiovascular: Regular rate and rhythm without murmur gallop or rub normal S1 and S2 Abdomen: Nontender, nondistended, soft, bowel sounds positive, no rebound, no ascites, no appreciable mass Extremities:  No significant cyanosis, clubbing, or edema bilateral lower extremities  Data Reviewed: Basic Metabolic Panel:  Recent Labs Lab 04/24/14 1949 04/24/14 2055 04/25/14 0537 04/28/14 0718  NA 136 139 140 138  K 3.8 3.8 4.0 3.4*  CL 102 104 107 105  CO2 19  --  24 26  GLUCOSE 166* 170* 140* 131*  BUN 28* 25* 26* 20  CREATININE 1.24 2.00* 1.00 0.75  CALCIUM 8.3*  --  8.1* 7.9*  MG  --   --  2.0  --   PHOS  --   --  3.6  --    Liver Function Tests:  Recent Labs Lab 04/24/14 1949 04/25/14 0537  AST 27 17  ALT 15 13  ALKPHOS 93 73  BILITOT 1.1 1.4*  PROT 6.7 5.9*  ALBUMIN 3.0* 2.5*   No results for input(s): LIPASE, AMYLASE in the last 168 hours. No results for input(s): AMMONIA in the last 168 hours. CBC:  Recent Labs Lab 04/24/14 1949 04/24/14 2055 04/25/14 0537  WBC 12.7*  --  9.9  NEUTROABS 11.6*  --   --   HGB 9.9* 10.9* 10.8*  HCT 30.7* 32.0* 33.7*  MCV 93.6  --  93.6  PLT 208  --  167   Cardiac Enzymes: No results for input(s): CKTOTAL, CKMB, CKMBINDEX, TROPONINI in the last 168 hours. BNP (last 3 results) No results for input(s): PROBNP in the last 8760 hours. CBG: No results for input(s): GLUCAP in the last 168 hours.  Recent Results (from the past 240 hour(s))  Culture, blood (routine x 2)     Status: None   Collection Time: 04/24/14  9:27 PM  Result Value Ref Range Status   Specimen Description BLOOD LEFT HAND  Final   Special Requests BOTTLES DRAWN AEROBIC AND ANAEROBIC 5CC  Final   Culture   Final    METHICILLIN RESISTANT STAPHYLOCOCCUS AUREUS Note: RIFAMPIN AND GENTAMICIN SHOULD NOT BE USED AS SINGLE DRUGS FOR TREATMENT OF STAPH INFECTIONS. CRITICAL RESULT CALLED TO, READ BACK BY AND VERIFIED WITH: KIM [facility] 04/28/14 @ 9:33AM BY RUSCOE A. Note: Gram Stain Report Called to,Read Back By and Verified With: [facility] VENNABLE RN 800P Performed at [facility]    Report Status 04/28/2014 FINAL  Final   Organism ID, Bacteria METHICILLIN  RESISTANT STAPHYLOCOCCUS AUREUS  Final      Susceptibility   Methicillin resistant staphylococcus aureus - MIC*    CLINDAMYCIN >=8 RESISTANT Resistant     ERYTHROMYCIN >=8 RESISTANT Resistant     GENTAMICIN <=0.5 SENSITIVE Sensitive     LEVOFLOXACIN >=8 RESISTANT Resistant     OXACILLIN >=4 RESISTANT Resistant     PENICILLIN >=0.5 RESISTANT Resistant     RIFAMPIN <=0.5 SENSITIVE Sensitive  TRIMETH/SULFA <=10 SENSITIVE Sensitive     VANCOMYCIN 1 SENSITIVE Sensitive     TETRACYCLINE <=1 SENSITIVE Sensitive     * METHICILLIN RESISTANT STAPHYLOCOCCUS AUREUS  Culture, blood (routine x 2)     Status: None (Preliminary result)   Collection Time: 04/24/14  9:29 PM  Result Value Ref Range Status   Specimen Description BLOOD RIGHT HAND  Final   Special Requests BOTTLES DRAWN AEROBIC AND ANAEROBIC 5CC  Final   Culture   Final           BLOOD CULTURE RECEIVED NO GROWTH TO DATE CULTURE WILL BE HELD FOR 5 DAYS BEFORE ISSUING A FINAL NEGATIVE REPORT Performed at [facility]    Report Status PENDING  Incomplete  Urine culture     Status: None (Preliminary result)   Collection Time: 04/25/14  9:09 AM  Result Value Ref Range Status   Specimen Description URINE, CATHETERIZED  Final   Special Requests Normal  Final   Colony Count   Final    70,000 COLONIES/ML Performed at [facility]    Culture   Final    STAPHYLOCOCCUS AUREUS Note: RIFAMPIN AND GENTAMICIN SHOULD NOT BE USED AS SINGLE DRUGS FOR TREATMENT OF STAPH INFECTIONS. Performed at [facility]    Report Status PENDING  Incomplete     Studies:  Recent x-ray studies have been reviewed in detail by the Attending Physician  Scheduled Meds:  Scheduled Meds: . antiseptic oral rinse  7 mL Mouth Rinse BID  . bisacodyl  10 mg Oral Daily  . docusate sodium  100 mg Oral BID  . potassium chloride  40 mEq Oral Once  . vancomycin  750 mg Intravenous Q24H   Continuous Infusions:   Time spent on care of this  patient: 35 min   [place], MD 04/28/2014, 11:11 AM  LOS: 4 days   [facility] Office  [phone] Pager - Text Page per [email or web address]  If 7PM-7AM, please contact night-coverage [email or web address]

## 2014-04-28 NOTE — Progress Notes (Signed)
Patient and family have requested that the PICC line not be inserted until the Palliative Care Consult has been completed and all options available to patient have been evaluated.  Will leave note for Dr. Wynelle Cleveland and notify oncoming RN of situation.  Christen Bame RN

## 2014-04-28 NOTE — Progress Notes (Signed)
PHARMACY - VANCOMYCIN (brief note)  Vancomycin 750mg  IV q24h for +MRSA bacteremia, urine culture shows S. Aureus (sensitivities pending)  Vancomycin trough level = 7.9 (Goal 15-20 mcg/ml) Scr = 0.8 with CrCl(N) ~ 62 ml/min  Plan:  Increase Vancomycin to 750mg  IV q12h           F/U final culture results/sensitivities           F/U plan of care  Leone Haven, PharmD

## 2014-04-28 NOTE — Progress Notes (Addendum)
ANTIBIOTIC CONSULT NOTE   Pharmacy Consult for Vancomycin Indication: MRSA bacteremia  Allergies  Allergen Reactions  . Codeine Nausea And Vomiting  . Tamsulosin Other (See Comments)    "Passed out"    Patient Measurements: Height: 5\' 9"  (175.3 cm) (stated) Weight: 137 lb 5.6 oz (62.3 kg) IBW/kg (Calculated) : 70.7 Height: 5'8"  Vital Signs: Temp: 98.2 F (36.8 C) (01/31 0659) Temp Source: Oral (01/31 0659) BP: 97/50 mmHg (01/31 0659) Pulse Rate: 71 (01/31 0659) Intake/Output from previous day: 01/30 0701 - 01/31 0700 In: 840 [P.O.:840] Out: 2775 [Urine:2775] Intake/Output from this shift:    Labs:  Recent Labs  04/28/14 0718  CREATININE 0.75   Estimated Creatinine Clearance: 54.1 mL/min (by C-G formula based on Cr of 0.75). No results for input(s): VANCOTROUGH, VANCOPEAK, VANCORANDOM, GENTTROUGH, GENTPEAK, GENTRANDOM, TOBRATROUGH, TOBRAPEAK, TOBRARND, AMIKACINPEAK, AMIKACINTROU, AMIKACIN in the last 72 hours.   Microbiology: Recent Results (from the past 720 hour(s))  Culture, blood (routine x 2)     Status: None   Collection Time: 04/24/14  9:27 PM  Result Value Ref Range Status   Specimen Description BLOOD LEFT HAND  Final   Special Requests BOTTLES DRAWN AEROBIC AND ANAEROBIC 5CC  Final   Culture   Final    METHICILLIN RESISTANT STAPHYLOCOCCUS AUREUS Note: RIFAMPIN AND GENTAMICIN SHOULD NOT BE USED AS SINGLE DRUGS FOR TREATMENT OF STAPH INFECTIONS. CRITICAL RESULT CALLED TO, READ BACK BY AND VERIFIED WITH: KIM [facility] 04/28/14 @ 9:33AM BY RUSCOE A. Note: Gram Stain Report Called to,Read Back By and Verified With: [facility] VENNABLE RN 800P Performed at [facility]    Report Status 04/28/2014 FINAL  Final   Organism ID, Bacteria METHICILLIN RESISTANT STAPHYLOCOCCUS AUREUS  Final      Susceptibility   Methicillin resistant staphylococcus aureus - MIC*    CLINDAMYCIN >=8 RESISTANT Resistant     ERYTHROMYCIN >=8 RESISTANT Resistant     GENTAMICIN  <=0.5 SENSITIVE Sensitive     LEVOFLOXACIN >=8 RESISTANT Resistant     OXACILLIN >=4 RESISTANT Resistant     PENICILLIN >=0.5 RESISTANT Resistant     RIFAMPIN <=0.5 SENSITIVE Sensitive     TRIMETH/SULFA <=10 SENSITIVE Sensitive     VANCOMYCIN 1 SENSITIVE Sensitive     TETRACYCLINE <=1 SENSITIVE Sensitive     * METHICILLIN RESISTANT STAPHYLOCOCCUS AUREUS  Culture, blood (routine x 2)     Status: None (Preliminary result)   Collection Time: 04/24/14  9:29 PM  Result Value Ref Range Status   Specimen Description BLOOD RIGHT HAND  Final   Special Requests BOTTLES DRAWN AEROBIC AND ANAEROBIC 5CC  Final   Culture   Final           BLOOD CULTURE RECEIVED NO GROWTH TO DATE CULTURE WILL BE HELD FOR 5 DAYS BEFORE ISSUING A FINAL NEGATIVE REPORT Performed at [facility]    Report Status PENDING  Incomplete  Urine culture     Status: None (Preliminary result)   Collection Time: 04/25/14  9:09 AM  Result Value Ref Range Status   Specimen Description URINE, CATHETERIZED  Final   Special Requests Normal  Final   Colony Count   Final    70,000 COLONIES/ML Performed at [facility]    Culture   Final    STAPHYLOCOCCUS AUREUS Note: RIFAMPIN AND GENTAMICIN SHOULD NOT BE USED AS SINGLE DRUGS FOR TREATMENT OF STAPH INFECTIONS. Performed at [facility]    Report Status PENDING  Incomplete     Assessment: 89  y/oM with PMH of prostate cancer, aortic aneurysm, and hx of DVT on Xarelto who presents to [facility] ED after fall. Foley catheter draining dark red blood. Pharmacy consulted to assist with dosing of Cefepime and Vancomycin for sepsis.  1/27 >> Cefepime >> 1/31 1/27 >> Vancomycin >>   Tmax: improved to afebrile 1/28 WBCs: improved to WNL 1/28 Renal: AKI resolved, SCr 2.0>0.75, Normalized= CrCl 62 ml/min (using Scr = 0.8) Lactic Acid: 6.11>2.85  1/27 blood x 2: MRSA 1/2 1/28 urine: S. Aureus 1/31 blood: pending  Drug level / dose changes info: 1/30:  2130 VT =  ____mcg/ml  On 750mg  IV q24h (prior to 4th dose of regimen, 5th overall dose)  Goal of Therapy:  Vancomycin trough level 15-20 mcg/ml  Appropriate antibiotic dosing for renal function and indication Eradication of infection  Plan:  Day #4 vancomycin  Continue vancomycin 750 mg IV q24h.  Check vancomycin trough tonight, renal function has improved and may  require q12h dosing, but since at or approaching Css will check trough prior  to making empiric dose adjustment  Plan is hospice at SNF - f/u aggressiveness of treatment  Repeat BC pending   Doreene Eland, PharmD, BCPS.   Pager: [phone] 04/28/2014 10:24 AM

## 2014-04-29 MED ORDER — HYDROCODONE-ACETAMINOPHEN 5-325 MG PO TABS: 1.0000 | ORAL_TABLET | ORAL | Status: AC | PRN

## 2014-04-29 MED ORDER — BISACODYL 5 MG PO TBEC: 10.0000 mg | DELAYED_RELEASE_TABLET | Freq: Every day | ORAL | Status: AC | PRN

## 2014-04-29 NOTE — Progress Notes (Signed)
Patient and family not wanting echocardiogram. Palliative Care.

## 2014-04-29 NOTE — Discharge Summary (Addendum)
Physician Discharge Summary  George Lara BPZ:[id number] DOB: 12/19/1921 DOA: 04/24/2014  PCP: Simona Huh, MD  Admit date: 04/24/2014 Discharge date: 04/29/2014  Time spent:55 minutes  Recommendations for Outpatient Follow-up:  1. Hospice follow up  Discharge Condition: stable Diet recommendation: regular diet  Discharge Diagnoses:  Principal Problem:   UTI (urinary tract infection) due to urinary indwelling Foley catheter Active Problems:   AKI (acute kidney injury)   Malignant neoplasm of prostate   COPD (chronic obstructive pulmonary disease)   Chronic indwelling Foley catheter   DVT of lower limb, acute   Pulmonary fibrosis   Protein-calorie malnutrition, severe   Sepsis   MRSA (methicillin resistant staph aureus) culture positive   History of present illness:  George Lara is a 81 y.o. male presenting to the hospital for fall and lethargy. Also noted to have a low grade fever and hematuria. Per family he has stopped eating and drinking and now is sleeping all day. She was found to have dehydration and acute renal failure (pre-renal).   Hospital Course:  Principal Problem:  UTI (urinary tract infection) due to urinary indwelling Foley catheter// Sepsis - U culture and one set of blood cx pos for MRSA-  - yesterday patient and family wanted to place a PICC and continue treatment with Vancomycin- they agreed with obtaining an ECHO as well and ID was consulted but now they have decided on comfort care only - the patient tells me today that he does not want treatment and understands that he might die from the infection-I have discontinued the order for PICC and Vanc and will be discharging him today.   Active Problems:  AKI (acute kidney injury) - due to refusal to eat and drink - improved with IVF-   Malignant neoplasm of prostate - the patient has declined treatment for this and therefore has a foley   COPD (chronic obstructive pulmonary  disease) - appears stable   Chronic indwelling Foley catheter with hematuria -Continue to follow hematuria-suspected is secondary to trauma   DVT of lower limb- diagnosed on 03/25/14 - Xarelto on hold due to severe hematuria which is continuing-small clots also noted in the urine but now improving -we will continue to hold Xarelto now as he is comfort care - of noted hematuria still persists   Protein-calorie malnutrition, severe- failure to thrive - very emaciated - have discussed that his prognosis is extremely poor as he presented with dehydration as a result of poor PO intake- they understand that he will continue to decline especially as he has mentally given up  - they have agreed that palliative care at nursing home is appropriate at this time-      Procedures:  none  Consultations:  ID  Discharge Exam: Filed Weights   04/24/14 2210 04/25/14 0022  Weight: 62.143 kg (137 lb) 62.3 kg (137 lb 5.6 oz)   Filed Vitals:   04/29/14 1034  BP: 101/65  Pulse: 78  Temp: 98.1 F (36.7 C)  Resp: 18    General: AAO x 3, no distress Cardiovascular: RRR, no murmurs  Respiratory: clear to auscultation bilaterally GI: soft, non-tender, non-distended, bowel sound positive  Discharge Instructions You were cared for by a hospitalist during your hospital stay. If you have any questions about your discharge medications or the care you received while you were in the hospital after you are discharged, you can call the unit and asked to speak with the hospitalist on call if the hospitalist that took care of  you is not available. Once you are discharged, your primary care physician will handle any further medical issues. Please note that NO REFILLS for any discharge medications will be authorized once you are discharged, as it is imperative that you return to your primary care physician (or establish a relationship with a primary care physician if you do not have one) for your aftercare  needs so that they can reassess your need for medications and monitor your lab values.      Discharge Instructions    Diet - low sodium heart healthy    Complete by:  As directed      Increase activity slowly    Complete by:  As directed             Medication List    STOP taking these medications        rivaroxaban 20 MG Tabs tablet  Commonly known as:  XARELTO      TAKE these medications        bisacodyl 5 MG EC tablet  Commonly known as:  DULCOLAX  Take 2 tablets (10 mg total) by mouth daily as needed for moderate constipation.     cyanocobalamin 1000 MCG tablet  Take 1 tablet (1,000 mcg total) by mouth daily.     HYDROcodone-acetaminophen 5-325 MG per tablet  Commonly known as:  NORCO/VICODIN  Take 1-2 tablets by mouth every 4 (four) hours as needed for moderate pain.     LITHOSTAT 250 MG Tabs  Generic drug:  Acetohydroxamic Acid  Take 1 tablet by mouth 3 (three) times daily.       Allergies  Allergen Reactions  . Codeine Nausea And Vomiting  . Tamsulosin Other (See Comments)    "Passed out"      The results of significant diagnostics from this hospitalization (including imaging, microbiology, ancillary and laboratory) are listed below for reference.    Significant Diagnostic Studies: Dg Chest 1 View  04/24/2014   CLINICAL DATA:  Found on floor. Confusion. Concern for chest injury. Initial encounter.  EXAM: CHEST - 1 VIEW  COMPARISON:  Chest radiograph performed 03/24/2014  FINDINGS: The lungs are well-aerated. Relatively stable patchy bilateral airspace opacities likely reflect chronic interstitial changes and fibrosis. No superimposed airspace consolidation is seen. No pleural effusion or pneumothorax is identified.  The cardiomediastinal silhouette is within normal limits. No acute osseous abnormalities are seen.  IMPRESSION: Relatively stable patchy bilateral airspace opacities likely reflect chronic interstitial changes and fibrosis. No acute  cardiopulmonary process seen. No displaced rib fractures identified.   Electronically Signed   By: Garald Balding M.D.   On: 04/24/2014 20:46   Dg Lumbar Spine Complete  04/24/2014   CLINICAL DATA:  Status post fall; acute onset of lower back pain. Initial encounter.  EXAM: LUMBAR SPINE - COMPLETE 4+ VIEW  COMPARISON:  Chest radiograph performed 03/07/2008  FINDINGS: There is compression deformity involving vertebral body T12, with approximately 60% loss of height. This is more likely chronic in nature, given minimal associated degenerative change, though it appears new from 2009.  Vertebral bodies otherwise demonstrate normal height and alignment. Intervertebral disc spaces are preserved. Mild facet disease is noted at the lower lumbar spine.  The visualized bowel gas pattern is unremarkable in appearance; air and stool are noted within the colon. The sacroiliac joints are within normal limits. Scattered clips are noted along the course of the abdominal aorta and aortic bifurcation.  IMPRESSION: Compression deformity at T12, with approximately 60% loss of height. This  is more likely chronic in nature, given minimal associated degenerative change, though it appears new from 2009. Would correlate for any associated symptoms.   Electronically Signed   By: Garald Balding M.D.   On: 04/24/2014 20:49   Ct Head Wo Contrast  04/24/2014   CLINICAL DATA:  Found on floor by son. Unsure if the head is head. Fall.  EXAM: CT HEAD WITHOUT CONTRAST  TECHNIQUE: Contiguous axial images were obtained from the base of the skull through the vertex without intravenous contrast.  COMPARISON:  None.  FINDINGS: There is mild diffuse low-attenuation within the subcortical and periventricular white matter compatible with chronic microvascular disease. There is prominence of the sulci and ventricles compatible with brain atrophy. No acute cortical infarct, hemorrhage, or mass lesion ispresent. No significant extra-axial fluid collection  is present. Asymmetric opacification of the left maxillary sinus noted. The remaining paranasal sinuses are clear. The mastoid air cells are clear. The osseous skull is intact.  IMPRESSION: 1. No acute intracranial abnormalities. 2. Small vessel ischemic change and brain atrophy. 3. Opacification of left maxillary sinus.   Electronically Signed   By: Kerby Moors M.D.   On: 04/24/2014 20:35    Microbiology: Recent Results (from the past 240 hour(s))  Culture, blood (routine x 2)     Status: None   Collection Time: 04/24/14  9:27 PM  Result Value Ref Range Status   Specimen Description BLOOD LEFT HAND  Final   Special Requests BOTTLES DRAWN AEROBIC AND ANAEROBIC 5CC  Final   Culture   Final    METHICILLIN RESISTANT STAPHYLOCOCCUS AUREUS Note: RIFAMPIN AND GENTAMICIN SHOULD NOT BE USED AS SINGLE DRUGS FOR TREATMENT OF STAPH INFECTIONS. CRITICAL RESULT CALLED TO, READ BACK BY AND VERIFIED WITH: KIM [facility] 04/28/14 @ 9:33AM BY RUSCOE A. Note: Gram Stain Report Called to,Read Back By and Verified With: [facility] VENNABLE RN 800P Performed at [facility]    Report Status 04/28/2014 FINAL  Final   Organism ID, Bacteria METHICILLIN RESISTANT STAPHYLOCOCCUS AUREUS  Final      Susceptibility   Methicillin resistant staphylococcus aureus - MIC*    CLINDAMYCIN >=8 RESISTANT Resistant     ERYTHROMYCIN >=8 RESISTANT Resistant     GENTAMICIN <=0.5 SENSITIVE Sensitive     LEVOFLOXACIN >=8 RESISTANT Resistant     OXACILLIN >=4 RESISTANT Resistant     PENICILLIN >=0.5 RESISTANT Resistant     RIFAMPIN <=0.5 SENSITIVE Sensitive     TRIMETH/SULFA <=10 SENSITIVE Sensitive     VANCOMYCIN 1 SENSITIVE Sensitive     TETRACYCLINE <=1 SENSITIVE Sensitive     * METHICILLIN RESISTANT STAPHYLOCOCCUS AUREUS  Culture, blood (routine x 2)     Status: None (Preliminary result)   Collection Time: 04/24/14  9:29 PM  Result Value Ref Range Status   Specimen Description BLOOD RIGHT HAND  Final   Special  Requests BOTTLES DRAWN AEROBIC AND ANAEROBIC 5CC  Final   Culture   Final           BLOOD CULTURE RECEIVED NO GROWTH TO DATE CULTURE WILL BE HELD FOR 5 DAYS BEFORE ISSUING A FINAL NEGATIVE REPORT Performed at [facility]    Report Status PENDING  Incomplete  Urine culture     Status: None   Collection Time: 04/25/14  9:09 AM  Result Value Ref Range Status   Specimen Description URINE, CATHETERIZED  Final   Special Requests Normal  Final   Colony Count   Final    70,000 COLONIES/ML Performed  at [employer]   Final    METHICILLIN RESISTANT STAPHYLOCOCCUS AUREUS Note: RIFAMPIN AND GENTAMICIN SHOULD NOT BE USED AS SINGLE DRUGS FOR TREATMENT OF STAPH INFECTIONS. Performed at [facility]    Report Status 04/28/2014 FINAL  Final   Organism ID, Bacteria METHICILLIN RESISTANT STAPHYLOCOCCUS AUREUS  Final      Susceptibility   Methicillin resistant staphylococcus aureus - MIC*    GENTAMICIN <=0.5 SENSITIVE Sensitive     LEVOFLOXACIN >=8 RESISTANT Resistant     NITROFURANTOIN <=16 SENSITIVE Sensitive     OXACILLIN >=4 RESISTANT Resistant     PENICILLIN >=0.5 RESISTANT Resistant     RIFAMPIN <=0.5 SENSITIVE Sensitive     TRIMETH/SULFA <=10 SENSITIVE Sensitive     VANCOMYCIN 1 SENSITIVE Sensitive     TETRACYCLINE <=1 SENSITIVE Sensitive     * METHICILLIN RESISTANT STAPHYLOCOCCUS AUREUS  Culture, blood (routine x 2)     Status: None (Preliminary result)   Collection Time: 04/28/14 10:04 AM  Result Value Ref Range Status   Specimen Description BLOOD LEFT ARM  Final   Special Requests BOTTLES DRAWN AEROBIC ONLY [place]  Final   Culture   Final           BLOOD CULTURE RECEIVED NO GROWTH TO DATE CULTURE WILL BE HELD FOR 5 DAYS BEFORE ISSUING A FINAL NEGATIVE REPORT Performed at [facility]    Report Status PENDING  Incomplete  Culture, blood (routine x 2)     Status: None (Preliminary result)   Collection Time: 04/28/14 10:10 AM  Result Value Ref  Range Status   Specimen Description BLOOD RIGHT ARM  Final   Special Requests   Final    BOTTLES DRAWN AEROBIC AND ANAEROBIC 10CC BOTH BOTTLES   Culture   Final           BLOOD CULTURE RECEIVED NO GROWTH TO DATE CULTURE WILL BE HELD FOR 5 DAYS BEFORE ISSUING A FINAL NEGATIVE REPORT Performed at [facility]    Report Status PENDING  Incomplete     Labs: Basic Metabolic Panel:  Recent Labs Lab 04/24/14 1949 04/24/14 2055 04/25/14 0537 04/28/14 0718  NA 136 139 140 138  K 3.8 3.8 4.0 3.4*  CL 102 104 107 105  CO2 19  --  24 26  GLUCOSE 166* 170* 140* 131*  BUN 28* 25* 26* 20  CREATININE 1.24 2.00* 1.00 0.75  CALCIUM 8.3*  --  8.1* 7.9*  MG  --   --  2.0  --   PHOS  --   --  3.6  --    Liver Function Tests:  Recent Labs Lab 04/24/14 1949 04/25/14 0537  AST 27 17  ALT 15 13  ALKPHOS 93 73  BILITOT 1.1 1.4*  PROT 6.7 5.9*  ALBUMIN 3.0* 2.5*   No results for input(s): LIPASE, AMYLASE in the last 168 hours. No results for input(s): AMMONIA in the last 168 hours. CBC:  Recent Labs Lab 04/24/14 1949 04/24/14 2055 04/25/14 0537  WBC 12.7*  --  9.9  NEUTROABS 11.6*  --   --   HGB 9.9* 10.9* 10.8*  HCT 30.7* 32.0* 33.7*  MCV 93.6  --  93.6  PLT 208  --  167   Cardiac Enzymes: No results for input(s): CKTOTAL, CKMB, CKMBINDEX, TROPONINI in the last 168 hours. BNP: BNP (last 3 results) No results for input(s): PROBNP in the last 8760 hours. CBG: No results for input(s): GLUCAP in the  last 168 hours.     SignedDebbe Odea, MD [facility] 04/29/2014, 11:39 AM

## 2014-04-29 NOTE — Clinical Social Work Note (Signed)
Patient for d/c today to private pay SNF bed at [facility]. Daughter and son at bedside and agreeable to this plan- plan transfer via EMS. Eduard Clos, MSW, [place]

## 2014-04-30 DIAGNOSIS — A4102 Sepsis due to Methicillin resistant Staphylococcus aureus: Secondary | ICD-10-CM

## 2014-04-30 DIAGNOSIS — J8410 Pulmonary fibrosis, unspecified: Secondary | ICD-10-CM

## 2014-04-30 DIAGNOSIS — B9562 Methicillin resistant Staphylococcus aureus infection as the cause of diseases classified elsewhere: Secondary | ICD-10-CM

## 2014-04-30 DIAGNOSIS — R339 Retention of urine, unspecified: Secondary | ICD-10-CM

## 2014-04-30 DIAGNOSIS — I82403 Acute embolism and thrombosis of unspecified deep veins of lower extremity, bilateral: Secondary | ICD-10-CM

## 2014-04-30 DIAGNOSIS — C61 Malignant neoplasm of prostate: Secondary | ICD-10-CM

## 2014-05-01 LAB — CULTURE, BLOOD (ROUTINE X 2): CULTURE: NO GROWTH

## 2014-05-04 LAB — CULTURE, BLOOD (ROUTINE X 2)
Culture: NO GROWTH
Culture: NO GROWTH

## 2014-05-22 ENCOUNTER — Telehealth: Payer: Self-pay | Admitting: Family Medicine

## 2014-05-22 NOTE — Telephone Encounter (Signed)
Gildardo Pounds from [place] called regarding Mr Lowrimore' death certificate. Please call back at [phone]. Thank you.

## 2014-05-23 NOTE — Telephone Encounter (Signed)
Left message on her identified voice mail that I don't have more information about the type of prostate cancer he had since I only took care of him for about 2 weeks. If she had other questions, she will have to call back and be more specific about what information she needs

## 2014-05-23 NOTE — Telephone Encounter (Signed)
The corrected number for Gildardo Pounds at [facility] is [phone]. Thanks.

## 2014-05-28 DEATH — deceased

## 2016-10-28 IMAGING — CT CT HEAD W/O CM
2 series · 17 of 30 positions shown, 20 images · non-contrast
Comparison: None.

CLINICAL DATA: Found on floor by son. Unsure if the head is head.
Fall.

EXAM:
CT HEAD WITHOUT CONTRAST
TECHNIQUE: Contiguous axial images were obtained from the base of the skull
through the vertex without intravenous contrast.

[Series 2: head w/o · axial · non-contrast · 0.42mm/px · z∈[+874,+994]mm · 9 of 31 slices shown, 12 images]
[im 4/31  brain]
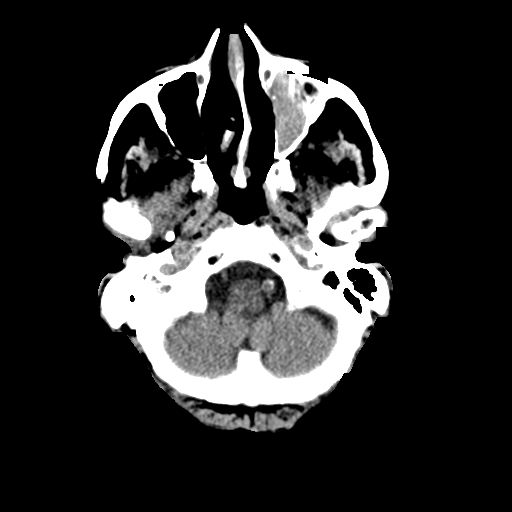
[im 4/31  bone]
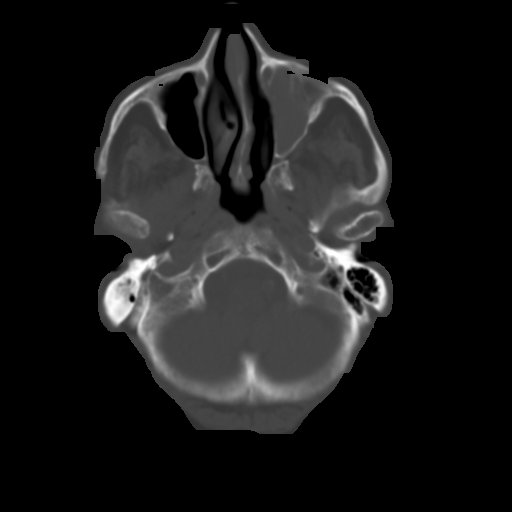
[im 7/31  brain]
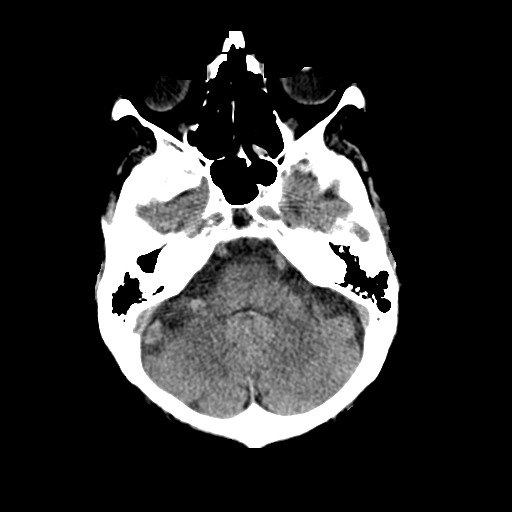
[im 10/31  brain]
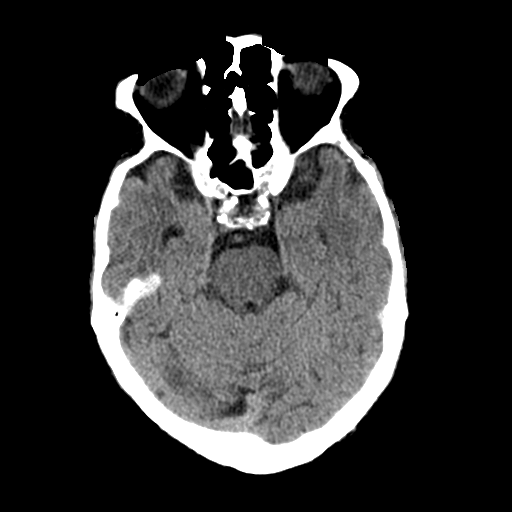
[im 13/31  brain]
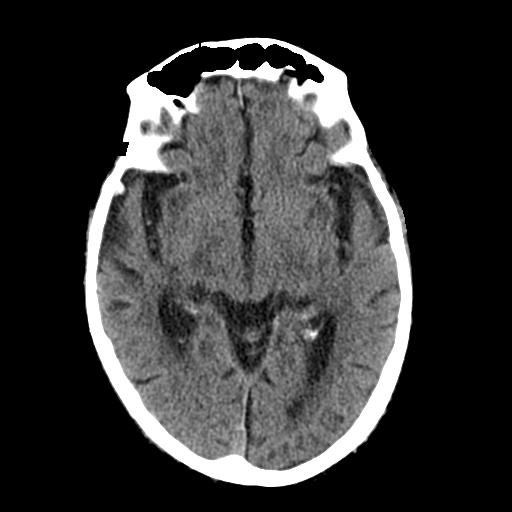
[im 16/31  brain]
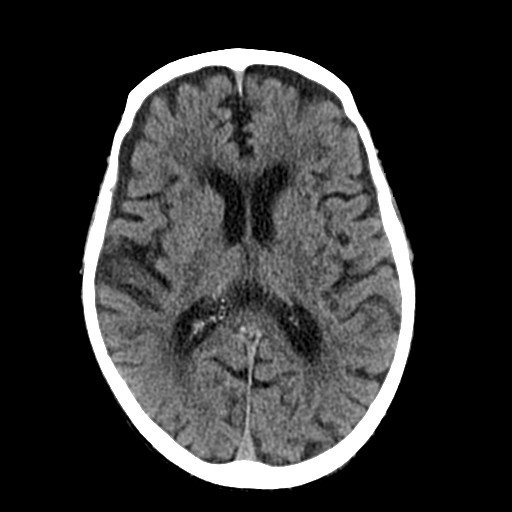
[im 16/31  bone]
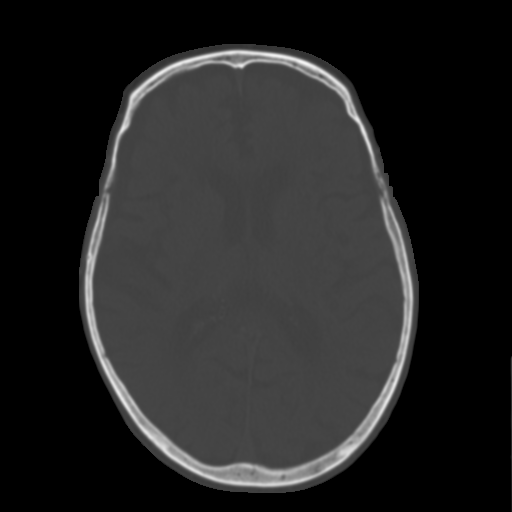
[im 19/31  brain]
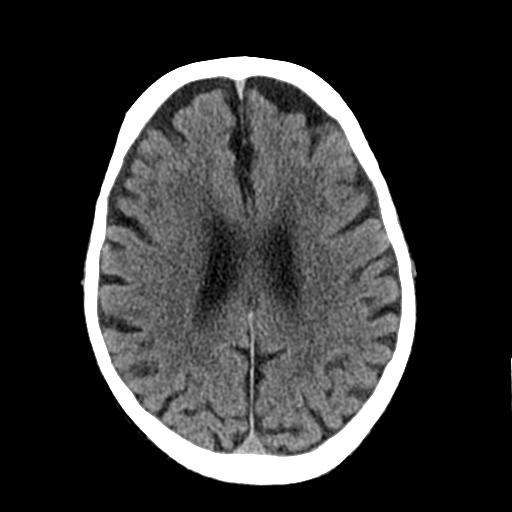
[im 22/31  brain]
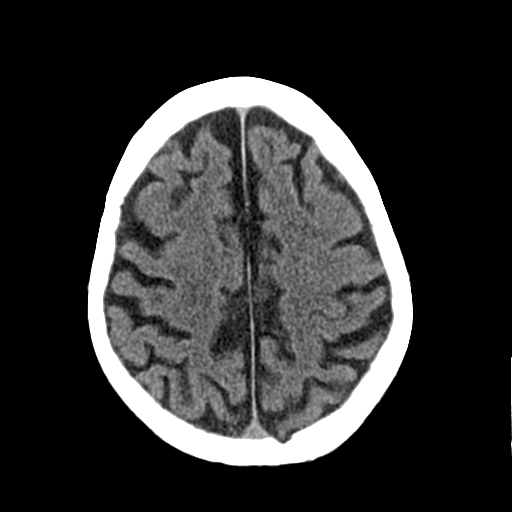
[im 25/31  brain]
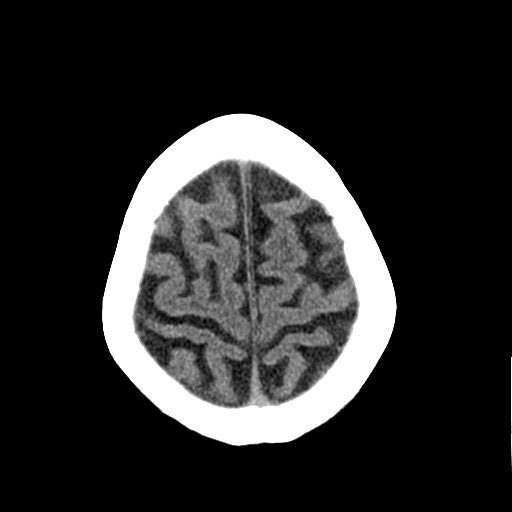
[im 28/31  brain]
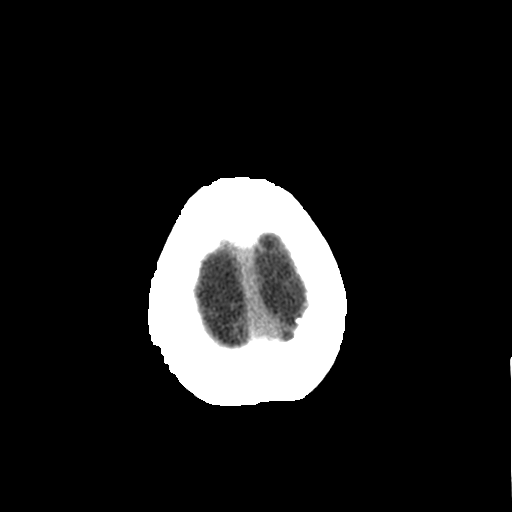
[im 28/31  bone]
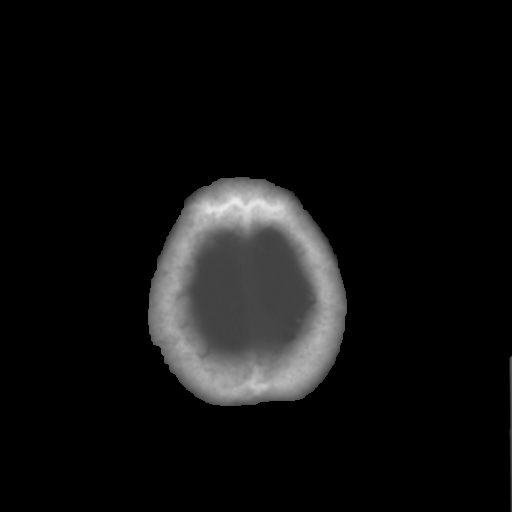

[Series 3: bone windows · axial · 0.42mm/px · z∈[+874,+991]mm · 8 of 51 slices shown]
[im 6/51  bone]
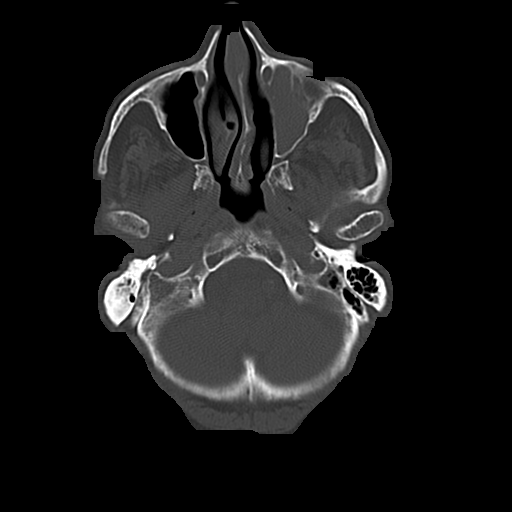
[im 12/51  bone]
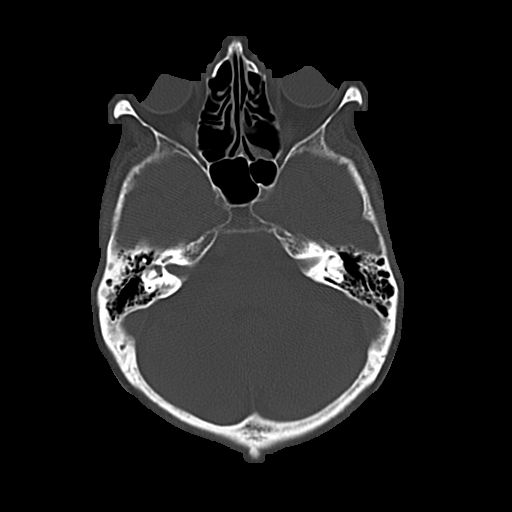
[im 17/51  bone]
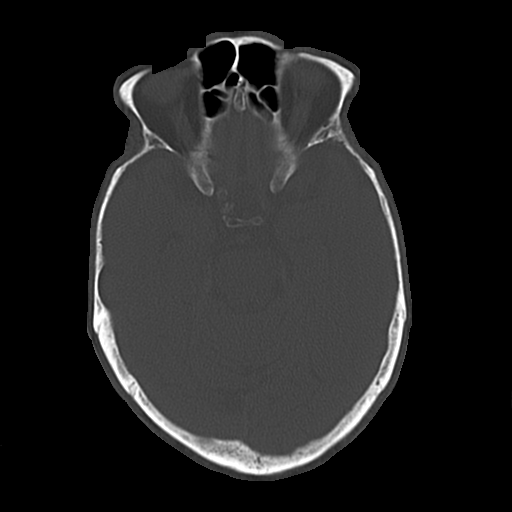
[im 23/51  bone]
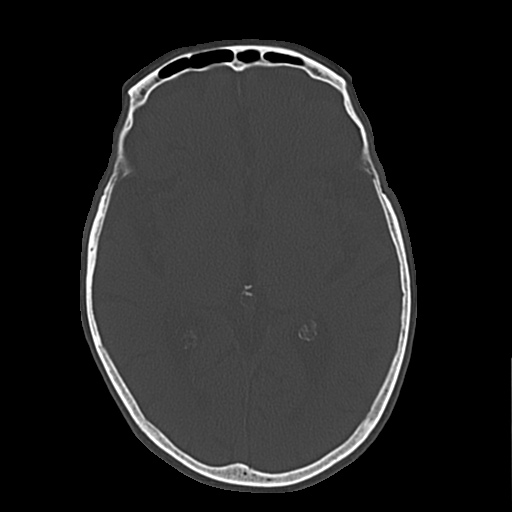
[im 28/51  bone]
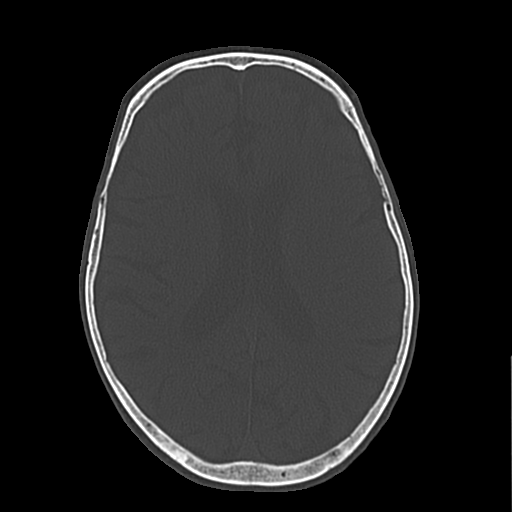
[im 34/51  bone]
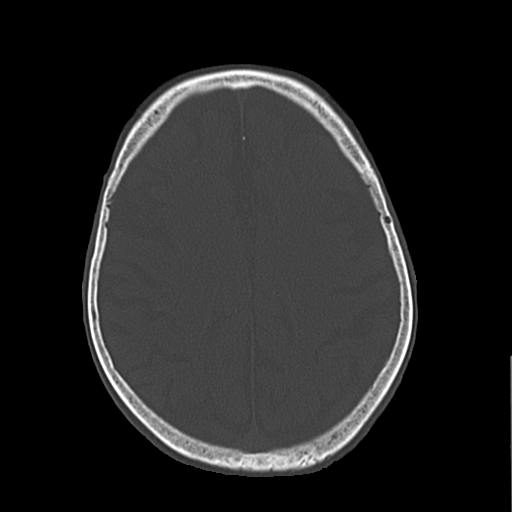
[im 39/51  bone]
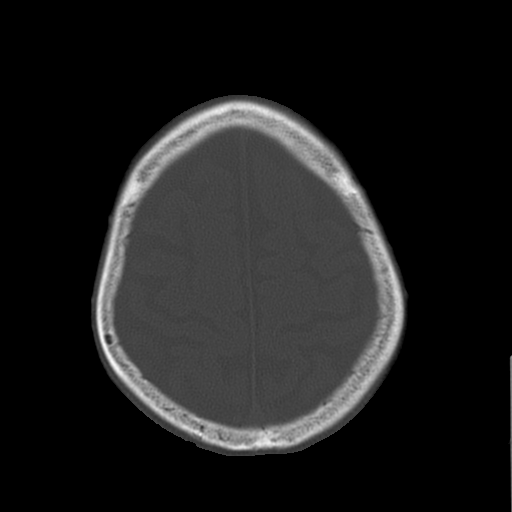
[im 45/51  bone]
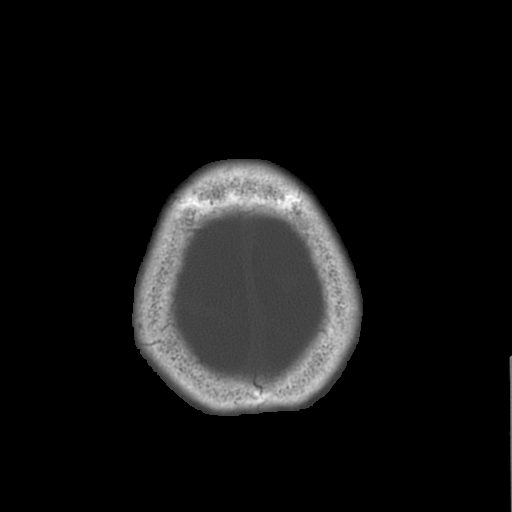

[17 of 30 positions shown; findings below may reference images not displayed]

FINDINGS: There is mild diffuse low-attenuation within the subcortical and
periventricular white matter compatible with chronic microvascular
disease. There is prominence of the sulci and ventricles compatible
with brain atrophy. No acute cortical infarct, hemorrhage, or mass
lesion ispresent. No significant extra-axial fluid collection is
present. Asymmetric opacification of the left maxillary sinus noted.
The remaining paranasal sinuses are clear. The mastoid air cells are
clear. The osseous skull is intact.
IMPRESSION: 1. No acute intracranial abnormalities.
2. Small vessel ischemic change and brain atrophy.
3. Opacification of left maxillary sinus.
